# Patient Record
Sex: Female | Born: 1985 | Race: Asian | Hispanic: No | Marital: Married | Smoking: Never smoker
Health system: Southern US, Community
[De-identification: ages and names within clinical notes are randomized; demographics above are authoritative.]

## PROBLEM LIST (undated history)

## (undated) DIAGNOSIS — Z789 Other specified health status: Secondary | ICD-10-CM

## (undated) HISTORY — PX: CYSTECTOMY: SUR359

## (undated) HISTORY — PX: LASIK: SHX215

---

## 2014-03-21 ENCOUNTER — Encounter (HOSPITAL_COMMUNITY): Payer: Self-pay

## 2014-03-21 ENCOUNTER — Inpatient Hospital Stay (HOSPITAL_COMMUNITY)
Admission: AD | Admit: 2014-03-21 | Discharge: 2014-03-21 | Disposition: A | Discharge status: Home / Self Care | Payer: Self-pay | Source: Ambulatory Visit | Attending: Obstetrics & Gynecology | Admitting: Obstetrics & Gynecology

## 2014-03-21 ENCOUNTER — Inpatient Hospital Stay (HOSPITAL_COMMUNITY): Payer: Self-pay

## 2014-03-21 DIAGNOSIS — O200 Threatened abortion: Secondary | ICD-10-CM

## 2014-03-21 DIAGNOSIS — R1032 Left lower quadrant pain: Secondary | ICD-10-CM | POA: Insufficient documentation

## 2014-03-21 DIAGNOSIS — O9989 Other specified diseases and conditions complicating pregnancy, childbirth and the puerperium: Secondary | ICD-10-CM

## 2014-03-21 DIAGNOSIS — R109 Unspecified abdominal pain: Secondary | ICD-10-CM

## 2014-03-21 DIAGNOSIS — O209 Hemorrhage in early pregnancy, unspecified: Secondary | ICD-10-CM

## 2014-03-21 DIAGNOSIS — O26899 Other specified pregnancy related conditions, unspecified trimester: Secondary | ICD-10-CM

## 2014-03-21 DIAGNOSIS — O4691 Antepartum hemorrhage, unspecified, first trimester: Secondary | ICD-10-CM

## 2014-03-21 DIAGNOSIS — Z3A08 8 weeks gestation of pregnancy: Secondary | ICD-10-CM | POA: Insufficient documentation

## 2014-03-21 LAB — URINE MICROSCOPIC-ADD ON

## 2014-03-21 LAB — WET PREP, GENITAL
Clue Cells Wet Prep HPF POC: NONE SEEN
Trich, Wet Prep: NONE SEEN
Yeast Wet Prep HPF POC: NONE SEEN

## 2014-03-21 LAB — CBC
HCT: 36.8 % (ref 36.0–46.0)
Hemoglobin: 12.6 g/dL (ref 12.0–15.0)
MCH: 31.7 pg (ref 26.0–34.0)
MCHC: 34.2 g/dL (ref 30.0–36.0)
MCV: 92.5 fL (ref 78.0–100.0)
Platelets: 280 10*3/uL (ref 150–400)
RBC: 3.98 MIL/uL (ref 3.87–5.11)
RDW: 13.2 % (ref 11.5–15.5)
WBC: 14.4 10*3/uL — ABNORMAL HIGH (ref 4.0–10.5)

## 2014-03-21 LAB — HCG, QUANTITATIVE, PREGNANCY: HCG, BETA CHAIN, QUANT, S: 338 m[IU]/mL — AB (ref <5)

## 2014-03-21 LAB — URINALYSIS, ROUTINE W REFLEX MICROSCOPIC
Bilirubin Urine: NEGATIVE
GLUCOSE, UA: NEGATIVE mg/dL
KETONES UR: 15 mg/dL — AB
LEUKOCYTES UA: NEGATIVE
Nitrite: NEGATIVE
PH: 7 (ref 5.0–8.0)
PROTEIN: 30 mg/dL — AB
Specific Gravity, Urine: 1.025 (ref 1.005–1.030)
Urobilinogen, UA: 0.2 mg/dL (ref 0.0–1.0)

## 2014-03-21 LAB — ABO/RH: ABO/RH(D): B POS

## 2014-03-21 NOTE — MAU Provider Note (Signed)
Chief Complaint: Vaginal Bleeding   First Provider Initiated Contact with Patient 03/21/14 1623     SUBJECTIVE HPI: Stephanie Potter is a 29 y.o. G1P0 at [id number] by LMP who presents to maternity admissions reporting vaginal bleeding that is moderate, like her usual menses, and severe abdominal pain a couple of hours ago, that is now resolved.  She had 2 positive UPTs over the weekend.  She reports onset of spotting 3 days ago that resolved, then LLQ pain yesterday.  This morning, her bleeding started again, and became heavier, requiring a pad. She called EMS and came to MAU related to the severe pain this afternoon.  She denies vaginal itching/burning, urinary symptoms, h/a, dizziness, n/v, or fever/chills.     History reviewed. No pertinent past medical history. History reviewed. No pertinent past surgical history. History   Social History  . Marital Status: Married    Spouse Name: N/A  . Number of Children: N/A  . Years of Education: N/A   Occupational History  . Not on file.   Social History Main Topics  . Smoking status: Never Smoker   . Smokeless tobacco: Not on file  . Alcohol Use: Not on file  . Drug Use: Not on file  . Sexual Activity: Yes    Birth Control/ Protection: None   Other Topics Concern  . Not on file   Social History Narrative  . No narrative on file   No current facility-administered medications on file prior to encounter.   No current outpatient prescriptions on file prior to encounter.   No Known Allergies  ROS: Pertinent items in HPI  OBJECTIVE Blood pressure 110/60, pulse 66, temperature 98 F (36.7 C), temperature source Oral, resp. rate 18, last menstrual period 01/19/2014. GENERAL: Well-developed, well-nourished female in no acute distress.  HEENT: Normocephalic HEART: normal rate RESP: normal effort ABDOMEN: Soft, non-tender EXTREMITIES: Nontender, no edema NEURO: Alert and oriented Pelvic exam: Cervix pink, visually closed, without lesion,  moderate amount dark red bleeding with some gray tissue in vaginal vault removed with ring forceps, vaginal walls and external genitalia normal Bimanual exam: Cervix 0/long/high, firm, anterior, neg CMT, uterus nontender, nonenlarged, adnexa without tenderness, enlargement, or mass  LAB RESULTS Results for orders placed or performed during the hospital encounter of 03/21/14 (from the past 24 hour(s))  Urinalysis, Routine w reflex microscopic     Status: Abnormal   Collection Time: 03/21/14  4:00 PM  Result Value Ref Range   Color, Urine AMBER (A) YELLOW   APPearance HAZY (A) CLEAR   Specific Gravity, Urine 1.025 1.005 - 1.030   pH 7.0 5.0 - 8.0   Glucose, UA NEGATIVE NEGATIVE mg/dL   Hgb urine dipstick LARGE (A) NEGATIVE   Bilirubin Urine NEGATIVE NEGATIVE   Ketones, ur 15 (A) NEGATIVE mg/dL   Protein, ur 30 (A) NEGATIVE mg/dL   Urobilinogen, UA 0.2 0.0 - 1.0 mg/dL   Nitrite NEGATIVE NEGATIVE   Leukocytes, UA NEGATIVE NEGATIVE  Urine microscopic-add on     Status: Abnormal   Collection Time: 03/21/14  4:00 PM  Result Value Ref Range   Squamous Epithelial / LPF RARE RARE   WBC, UA 0-2 <3 WBC/hpf   RBC / HPF TOO NUMEROUS TO COUNT <3 RBC/hpf   Bacteria, UA FEW (A) RARE   Urine-Other MUCOUS PRESENT   Wet prep, genital     Status: Abnormal   Collection Time: 03/21/14  4:29 PM  Result Value Ref Range   Yeast Wet Prep HPF POC NONE SEEN  NONE SEEN   Trich, Wet Prep NONE SEEN NONE SEEN   Clue Cells Wet Prep HPF POC NONE SEEN NONE SEEN   WBC, Wet Prep HPF POC FEW (A) NONE SEEN  CBC     Status: Abnormal   Collection Time: 03/21/14  4:50 PM  Result Value Ref Range   WBC 14.4 (H) 4.0 - 10.5 K/uL   RBC 3.98 3.87 - 5.11 MIL/uL   Hemoglobin 12.6 12.0 - 15.0 g/dL   HCT 16.1 09.6 - 04.5 %   MCV 92.5 78.0 - 100.0 fL   MCH 31.7 26.0 - 34.0 pg   MCHC 34.2 30.0 - 36.0 g/dL   RDW 40.9 81.1 - 91.4 %   Platelets 280 150 - 400 K/uL  hCG, quantitative, pregnancy     Status: Abnormal   Collection  Time: 03/21/14  4:50 PM  Result Value Ref Range   hCG, Beta Chain, Quant, S 338 (H) <5 mIU/mL  ABO/Rh     Status: None (Preliminary result)   Collection Time: 03/21/14  4:50 PM  Result Value Ref Range   ABO/RH(D) B POS     IMAGING US Ob Comp Less 14 Wks  03/21/2014   CLINICAL DATA:  Heavy vaginal bleeding starting at 10 a.m. today. Early pregnancy.  EXAM: OBSTETRIC <14 WK [place] AND TRANSVAGINAL OB US  TECHNIQUE: Both transabdominal and transvaginal ultrasound examinations were performed for complete evaluation of the gestation as well as the maternal uterus, adnexal regions, and pelvic cul-de-sac. Transvaginal technique was performed to assess early pregnancy.  COMPARISON:  None.  FINDINGS: Intrauterine gestational sac: Visualized, but abnormal, with heterogeneous echogenicity in a 1.4 by 1.1 by 1.2 cm sac.  Yolk sac:  Not definitively seen.  Embryo:  Not definitively seen  Cardiac Activity: Absent  MSD: 12  mm   6 w   0  d  Maternal uterus/adnexae: Small amount of free pelvic fluid. The ovaries appear unremarkable.  IMPRESSION: 1. Abnormal gestational sac versus blood products in the endometrial canal, with mixed echogenicity, high echogenicity rim, and irregular internal high echogenicity within the apparent sac. This appearance is of very low likelihood to be compatible with a successful pregnancy. Currently no discernible yolk sac or embryo is specifically identifiable.   Electronically Signed   By: Gaylyn Rong M.D.   On: 03/21/2014 18:18   US Ob Transvaginal  03/21/2014   CLINICAL DATA:  Heavy vaginal bleeding starting at 10 a.m. today. Early pregnancy.  EXAM: OBSTETRIC <14 WK [place] AND TRANSVAGINAL OB US  TECHNIQUE: Both transabdominal and transvaginal ultrasound examinations were performed for complete evaluation of the gestation as well as the maternal uterus, adnexal regions, and pelvic cul-de-sac. Transvaginal technique was performed to assess early pregnancy.  COMPARISON:  None.  FINDINGS:  Intrauterine gestational sac: Visualized, but abnormal, with heterogeneous echogenicity in a 1.4 by 1.1 by 1.2 cm sac.  Yolk sac:  Not definitively seen.  Embryo:  Not definitively seen  Cardiac Activity: Absent  MSD: 12  mm   6 w   0  d  Maternal uterus/adnexae: Small amount of free pelvic fluid. The ovaries appear unremarkable.  IMPRESSION: 1. Abnormal gestational sac versus blood products in the endometrial canal, with mixed echogenicity, high echogenicity rim, and irregular internal high echogenicity within the apparent sac. This appearance is of very low likelihood to be compatible with a successful pregnancy. Currently no discernible yolk sac or embryo is specifically identifiable.   Electronically Signed   By: Annitta Needs.D.  On: 03/21/2014 18:18    ASSESSMENT 1. Abdominal pain affecting pregnancy   2. Vaginal bleeding in pregnancy, first trimester   3. Threatened miscarriage in early pregnancy     PLAN Discharge home with bleeding precautions Discussed likely miscarriage with pt and s/o.  Questions answered. Pt to return to MAU in 48 hours  Follow-up Information    Follow up with [facility] [place] [facility].   Why:  On Wednesday after 5 pm for repeat labs.  Return sooner as needed for emergencies.   Contact information:   [address] [id number] mc [place] [address] [phone]      Sharen Counter Certified Nurse-Midwife 03/21/2014  7:16 PM

## 2014-03-21 NOTE — MAU Note (Signed)
UPT positive

## 2014-03-21 NOTE — Discharge Instructions (Signed)

## 2014-03-21 NOTE — MAU Note (Signed)
Pt presents complaining of heavy vaginal bleeding and lower abdominal pain that is so bad it makes her sweat. States she had two +HPTs this weekend.

## 2014-03-22 LAB — GC/CHLAMYDIA PROBE AMP (~~LOC~~) NOT AT ARMC
Chlamydia: NEGATIVE
Neisseria Gonorrhea: NEGATIVE

## 2014-03-22 LAB — HIV ANTIBODY (ROUTINE TESTING W REFLEX): HIV Screen 4th Generation wRfx: NONREACTIVE

## 2014-03-23 ENCOUNTER — Inpatient Hospital Stay (HOSPITAL_COMMUNITY)
Admission: AD | Admit: 2014-03-23 | Discharge: 2014-03-23 | Discharge status: Against Medical Advice | Payer: Self-pay | Source: Ambulatory Visit | Attending: Obstetrics and Gynecology | Admitting: Obstetrics and Gynecology

## 2014-03-23 DIAGNOSIS — Z5321 Procedure and treatment not carried out due to patient leaving prior to being seen by health care provider: Secondary | ICD-10-CM | POA: Insufficient documentation

## 2014-03-23 LAB — HCG, QUANTITATIVE, PREGNANCY: hCG, Beta Chain, Quant, S: 72 m[IU]/mL — ABNORMAL HIGH (ref <5)

## 2014-03-23 NOTE — MAU Note (Signed)
Not in lobby

## 2014-03-23 NOTE — MAU Note (Signed)
Per registration pt reported she was leaving and would call back for results. Pt not in lobby.

## 2015-01-24 ENCOUNTER — Encounter (HOSPITAL_COMMUNITY): Payer: Self-pay | Admitting: *Deleted

## 2015-10-09 IMAGING — US US OB COMP LESS 14 WK
1 series · 14 of 28 positions shown · non-contrast
Comparison: None.

CLINICAL DATA: Heavy vaginal bleeding starting at 10 a.m. today.
Early pregnancy.

EXAM:
OBSTETRIC <14 WK US AND TRANSVAGINAL OB US
TECHNIQUE: Both transabdominal and transvaginal ultrasound examinations were
performed for complete evaluation of the gestation as well as the
maternal uterus, adnexal regions, and pelvic cul-de-sac.
Transvaginal technique was performed to assess early pregnancy.

[Series 1: us ob comp less 14 wk · 32 acquisitions, 14 frames shown]
[im 2/32]
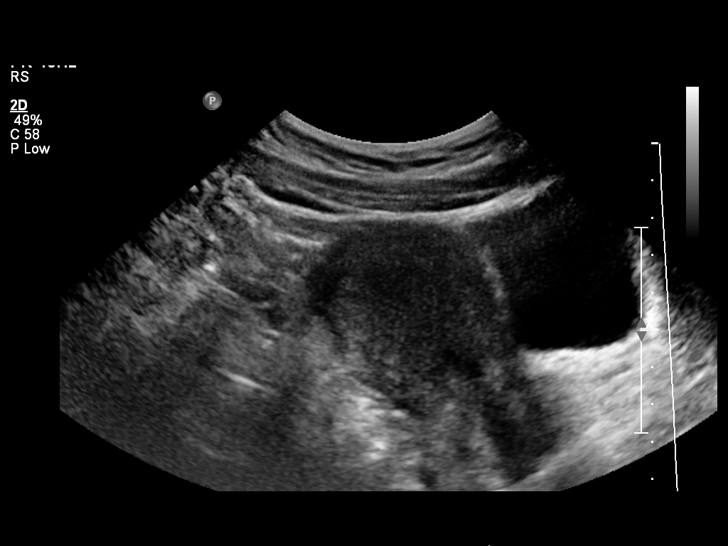
[im 4/32]
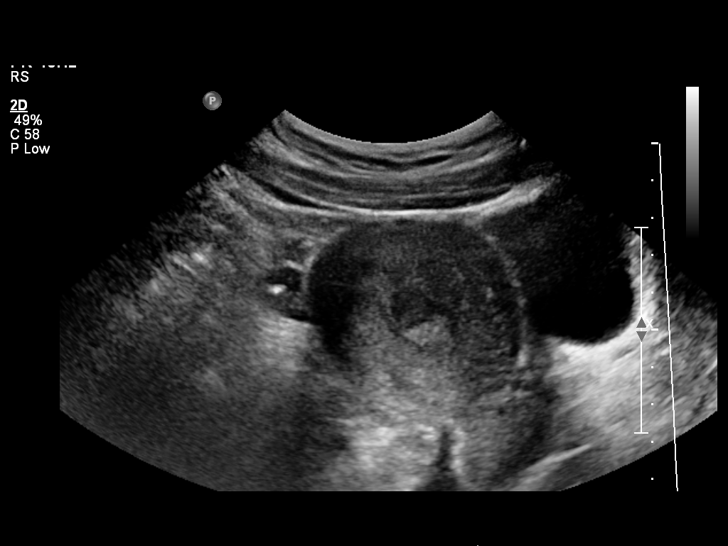
[im 6/32]
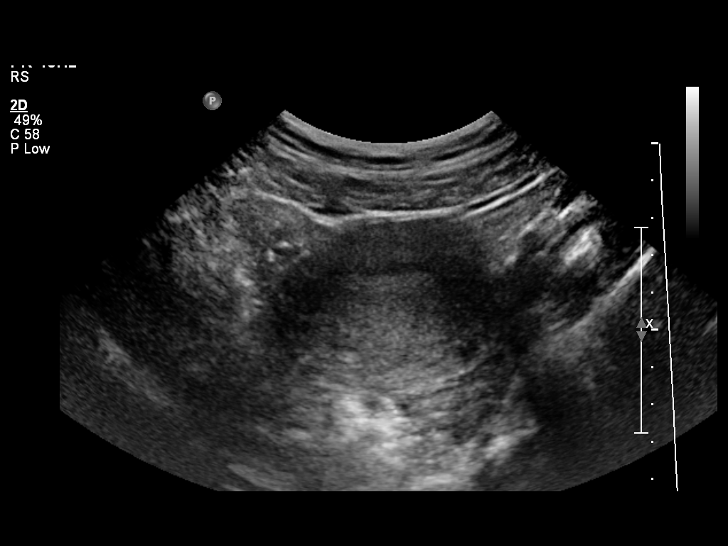
[im 9/32]
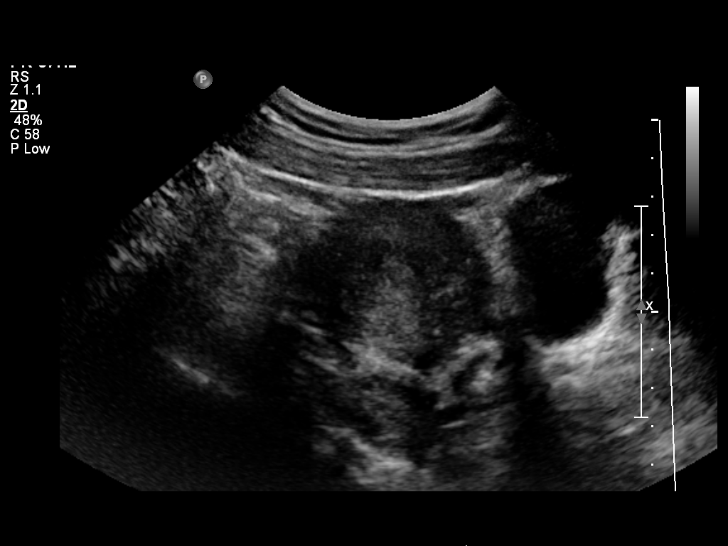
[im 11/32]
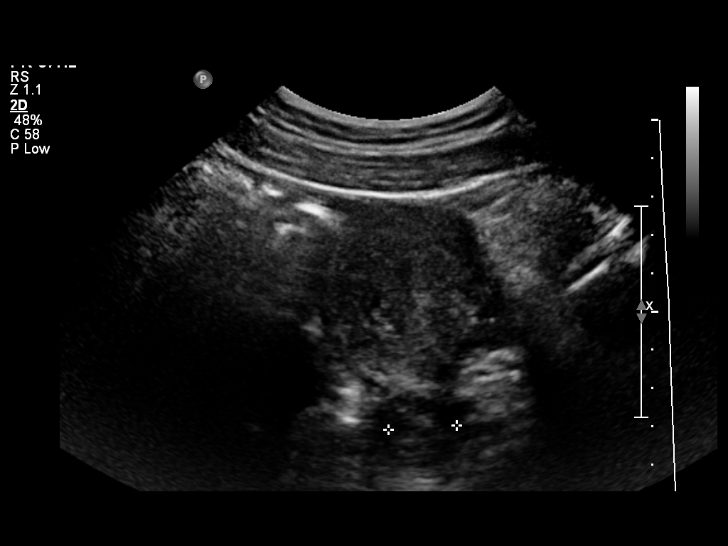
[im 13/32]
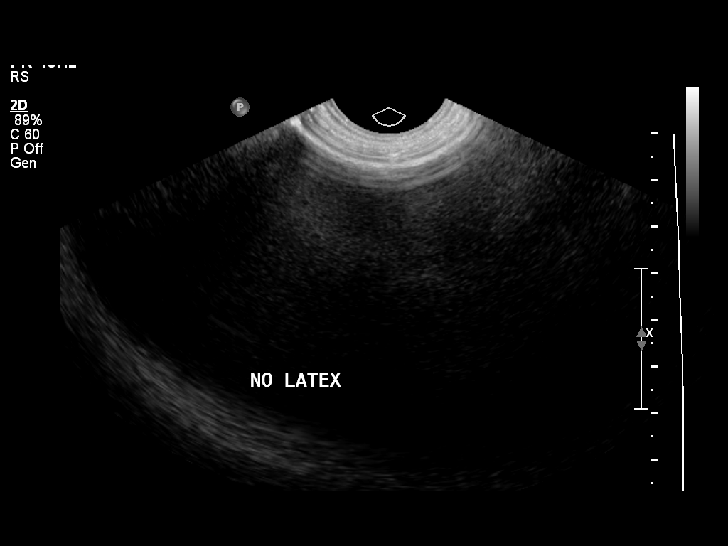
[im 15/32]
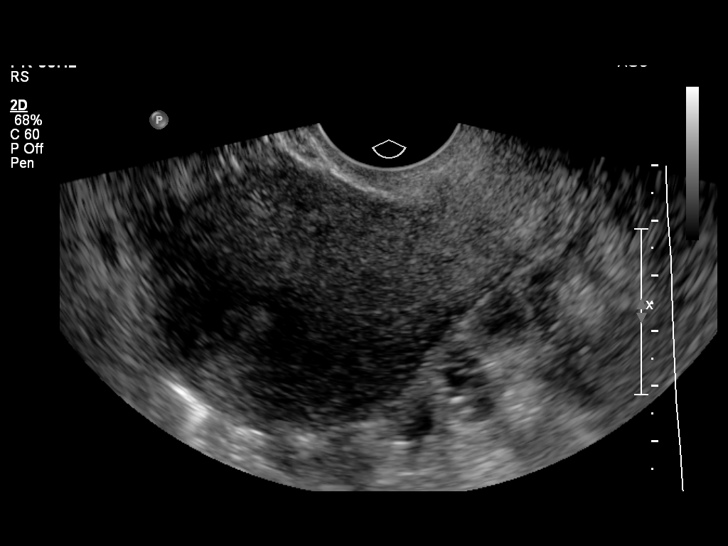
[im 18/32]
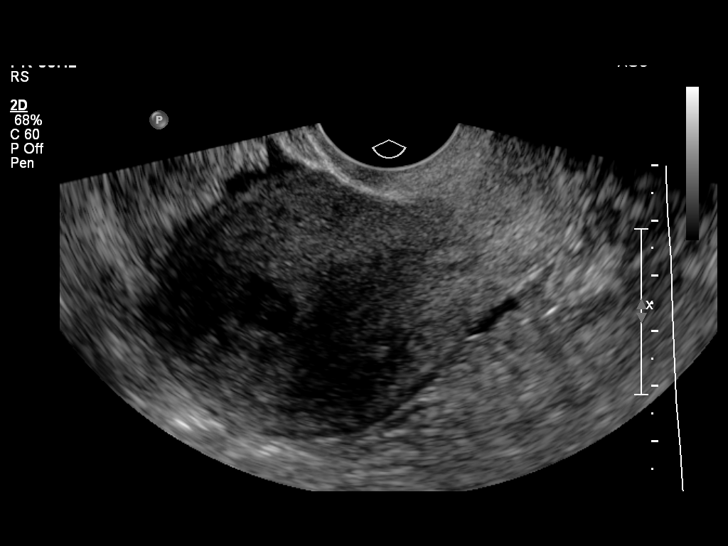
[im 20/32]
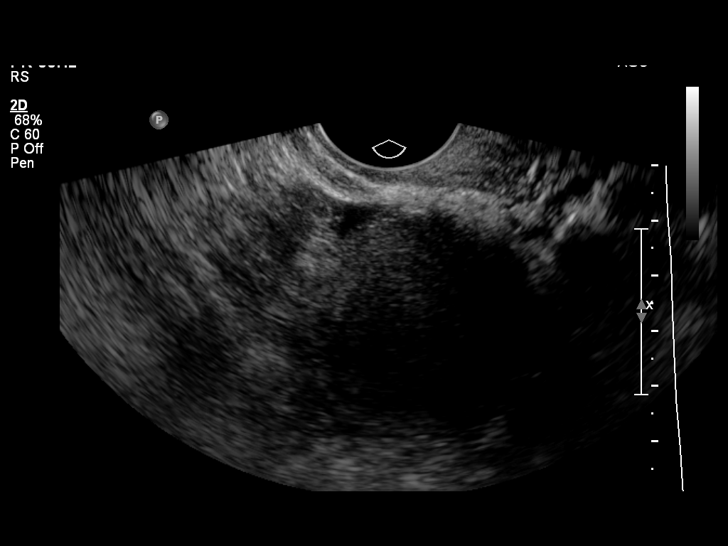
[im 22/32]
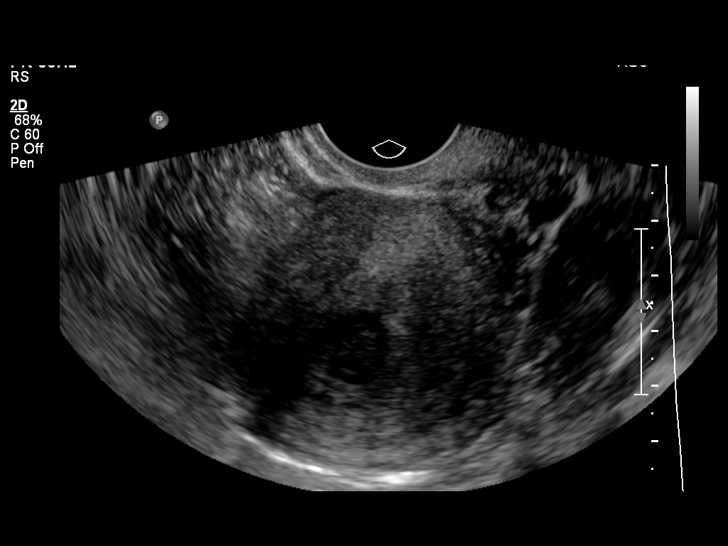
[im 25/32]
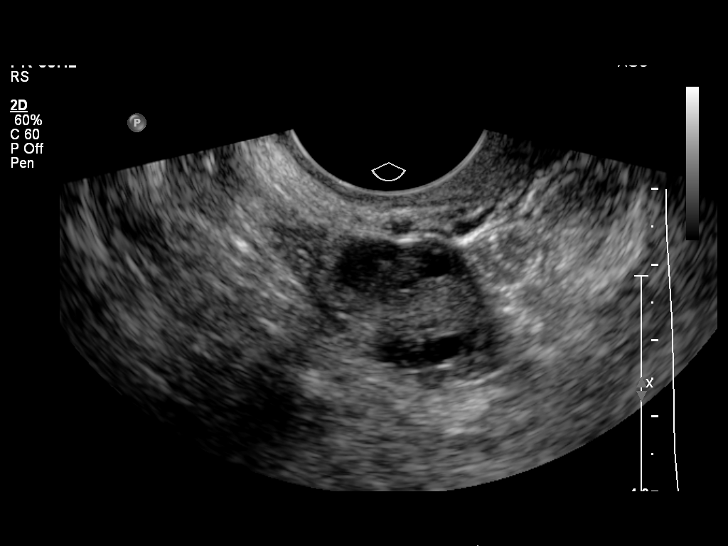
[im 27/32]
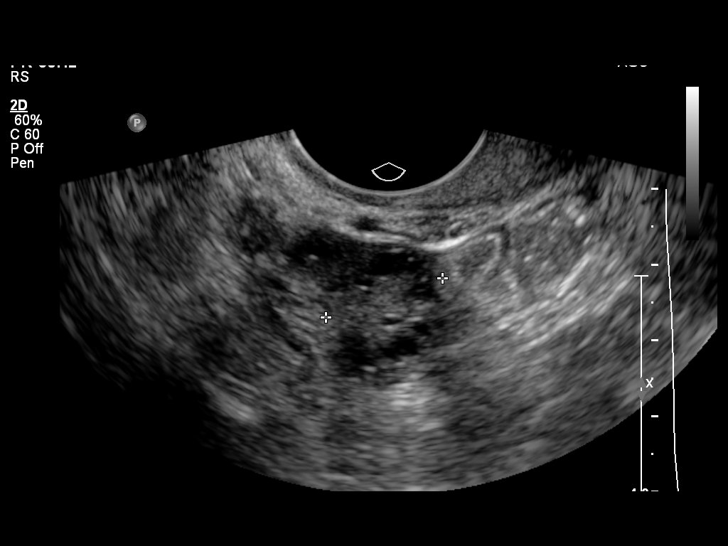
[im 29/32]
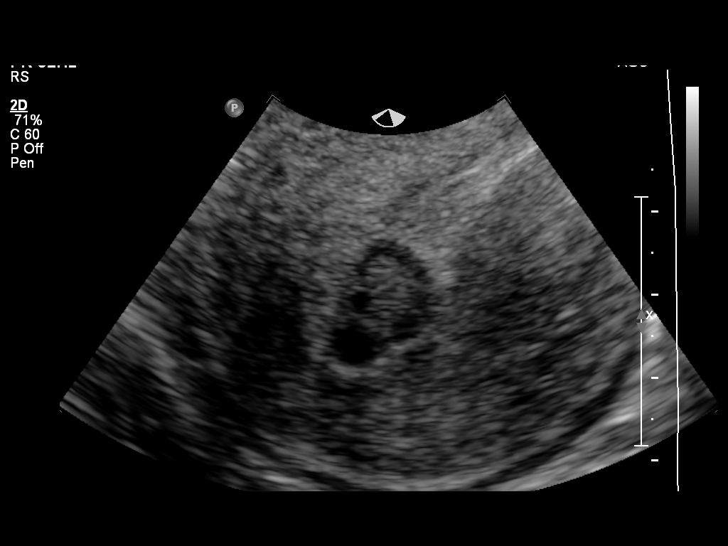
[im 32/32]
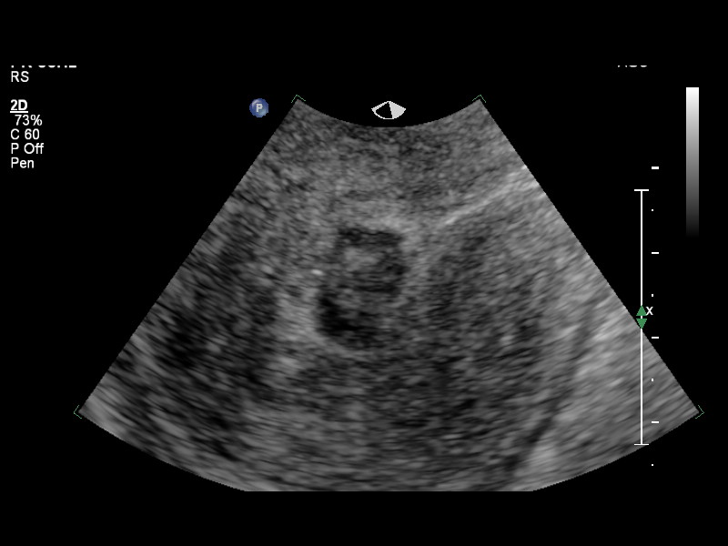

[14 of 28 positions shown; findings below may reference images not displayed]

FINDINGS: Intrauterine gestational sac: Visualized, but abnormal, with
heterogeneous echogenicity in a 1.4 by 1.1 by 1.2 cm sac.

Yolk sac:  Not definitively seen.

Embryo:  Not definitively seen

Cardiac Activity: Absent

MSD: 12  mm   6 w   0  d

Maternal uterus/adnexae: Small amount of free pelvic fluid. The
ovaries appear unremarkable.
IMPRESSION: 1. Abnormal gestational sac versus blood products in the endometrial
canal, with mixed echogenicity, high echogenicity rim, and irregular
internal high echogenicity within the apparent sac. This appearance
is of very low likelihood to be compatible with a successful
pregnancy. Currently no discernible yolk sac or embryo is
specifically identifiable.

## 2015-11-01 ENCOUNTER — Other Ambulatory Visit: Payer: Self-pay | Admitting: Obstetrics and Gynecology

## 2015-11-01 ENCOUNTER — Encounter: Payer: Self-pay | Admitting: Obstetrics and Gynecology

## 2015-11-01 ENCOUNTER — Ambulatory Visit (INDEPENDENT_AMBULATORY_CARE_PROVIDER_SITE_OTHER): Payer: BLUE CROSS/BLUE SHIELD | Admitting: Obstetrics and Gynecology

## 2015-11-01 VITALS — BP 111/70 | HR 68 | Ht 61.0 in | Wt 115.9 lb

## 2015-11-01 DIAGNOSIS — Z3201 Encounter for pregnancy test, result positive: Secondary | ICD-10-CM

## 2015-11-01 DIAGNOSIS — O3680X0 Pregnancy with inconclusive fetal viability, not applicable or unspecified: Secondary | ICD-10-CM

## 2015-11-01 DIAGNOSIS — N926 Irregular menstruation, unspecified: Secondary | ICD-10-CM

## 2015-11-01 DIAGNOSIS — Z3687 Encounter for antenatal screening for uncertain dates: Secondary | ICD-10-CM

## 2015-11-01 LAB — POCT URINE PREGNANCY: Preg Test, Ur: POSITIVE — AB

## 2015-11-01 NOTE — Progress Notes (Signed)
    GYNECOLOGY CLINIC PROGRESS NOTE  Subjective:    Stephanie Potter is a 30 y.o. [id number] female who presents for evaluation of menstrual irregularity. She believes she could be pregnant. Pregnancy is desired. Sexual Activity: single partner, contraception: none. Current symptoms also include: nausea and positive home pregnancy test. Last period was abnormal.  Patient's last menstrual period was 05/23/2015 (exact date). Reports a negative urine pregnancy test August 9th, Sept 4 pregnancy test was positive.    The following portions of the patient's history were reviewed and updated as appropriate:   She  has no past medical history on file.   She  has a past surgical history that includes LASIK.   Her family history is not on file.   She  reports that she has never smoked. She has never used smokeless tobacco. She reports that she does not drink alcohol or use drugs.   She has a current medication list which includes the following prescription(s): prenatal vit-fe fumarate-fa.   She has No Known Allergies..  Review of Systems Pertinent items noted in HPI and remainder of comprehensive ROS otherwise negative.     Objective:    BP 111/70   Pulse 68   Ht 5\' 1"  (1.549 m)   Wt 115 lb 14.4 oz (52.6 kg)   LMP 05/23/2015 (Exact Date) Comment: Irregular menses  BMI 21.90 kg/m  General: alert, no distress and no acute distress   Abdomen:  Soft, non-tender.  No masses palpable.  No fetal heart tones appreciated     Lab Review Urine HCG: positive    Assessment:    Absence of menstruation.    Irregular menses, with unsure LMP   Plan:    Pregnancy Test: Positive: EDC: unknown due to irregular menses and unsure LMP. Briefly discussed pre-natal care options. Pregnancy, Childbirth and the Newborn book given. Encouraged well-balanced diet, plenty of rest when needed, pre-natal vitamins daily and walking for exercise. Discussed self-help for nausea, avoiding OTC medications until consulting  provider or pharmacist, other than Tylenol as needed, minimal caffeine (1-2 cups daily) and avoiding alcohol. She will schedule her initial OB visit in the next month with her PCP or OB provider. Feel free to call with any questions. Will schedule patient for dating scan in next several days.      Hildred LaserAnika Tala Eber, MD [facility]

## 2015-11-01 NOTE — Patient Instructions (Signed)
Prenatal Care °WHAT IS PRENATAL CARE?  °Prenatal care is the process of caring for a pregnant woman before she gives birth. Prenatal care makes sure that she and her baby remain as healthy as possible throughout pregnancy. Prenatal care may be provided by a midwife, family practice health care provider, or a childbirth and pregnancy specialist (obstetrician). Prenatal care may include physical examinations, testing, treatments, and education on nutrition, lifestyle, and social support services. °WHY IS PRENATAL CARE SO IMPORTANT?  °Early and consistent prenatal care increases the chance that you and your baby will remain healthy throughout your pregnancy. This type of care also decreases a baby's risk of being born too early (prematurely), or being born smaller than expected (small for gestational age). Any underlying medical conditions you may have that could pose a risk during your pregnancy are discussed during prenatal care visits. You will also be monitored regularly for any new conditions that may arise during your pregnancy so they can be treated quickly and effectively. °WHAT HAPPENS DURING PRENATAL CARE VISITS? °Prenatal care visits may include the following: °Discussion °Tell your health care provider about any new signs or symptoms you have experienced since your last visit. These might include: °· Nausea or vomiting. °· Increased or decreased level of energy. °· Difficulty sleeping. °· Back or leg pain. °· Weight changes. °· Frequent urination. °· Shortness of breath with physical activity. °· Changes in your skin, such as the development of a rash or itchiness. °· Vaginal discharge or bleeding. °· Feelings of excitement or nervousness. °· Changes in your baby's movements. °You may want to write down any questions or topics you want to discuss with your health care provider and bring them with you to your appointment. °Examination °During your first prenatal care visit, you will likely have a complete  physical exam. Your health care provider will often examine your vagina, cervix, and the position of your uterus, as well as check your heart, lungs, and other body systems. As your pregnancy progresses, your health care provider will measure the size of your uterus and your baby's position inside your uterus. He or she may also examine you for early signs of labor. Your prenatal visits may also include checking your blood pressure and, after about 10-12 weeks of pregnancy, listening to your baby's heartbeat. °Testing °Regular testing often includes: °· Urinalysis. This checks your urine for glucose, protein, or signs of infection. °· Blood count. This checks the levels of white and red blood cells in your body. °· Tests for sexually transmitted infections (STIs). Testing for STIs at the beginning of pregnancy is routinely done and is required in many states. °· Antibody testing. You will be checked to see if you are immune to certain illnesses, such as rubella, that can affect a developing fetus. °· Glucose screen. Around 24-28 weeks of pregnancy, your blood glucose level will be checked for signs of gestational diabetes. Follow-up tests may be recommended. °· Group B strep. This is a bacteria that is commonly found inside a woman's vagina. This test will inform your health care provider if you need an antibiotic to reduce the amount of this bacteria in your body prior to labor and childbirth. °· Ultrasound. Many pregnant women undergo an ultrasound screening around 18-20 weeks of pregnancy to evaluate the health of the fetus and check for any developmental abnormalities. °· HIV (human immunodeficiency virus) testing. Early in your pregnancy, you will be screened for HIV. If you are at high risk for HIV, this test   may be repeated during your third trimester of pregnancy. °You may be offered other testing based on your age, personal or family medical history, or other factors.  °HOW OFTEN SHOULD I PLAN TO SEE MY  HEALTH CARE PROVIDER FOR PRENATAL CARE? °Your prenatal care check-up schedule depends on any medical conditions you have before, or develop during, your pregnancy. If you do not have any underlying medical conditions, you will likely be seen for checkups: °· Monthly, during the first 6 months of pregnancy. °· Twice a month during months 7 and 8 of pregnancy. °· Weekly starting in the 9th month of pregnancy and until delivery. °If you develop signs of early labor or other concerning signs or symptoms, you may need to see your health care provider more often. Ask your health care provider what prenatal care schedule is best for you. °WHAT CAN I DO TO KEEP MYSELF AND MY BABY AS HEALTHY AS POSSIBLE DURING MY PREGNANCY? °· Take a prenatal vitamin containing 400 micrograms (0.4 mg) of folic acid every day. Your health care provider may also ask you to take additional vitamins such as iodine, vitamin D, iron, copper, and zinc. °· Take 1500-2000 mg of calcium daily starting at your 20th week of pregnancy until you deliver your baby. °· Make sure you are up to date on your vaccinations. Unless directed otherwise by your health care provider: °¨ You should receive a tetanus, diphtheria, and pertussis (Tdap) vaccination between the 27th and 36th week of your pregnancy, regardless of when your last Tdap immunization occurred. This helps protect your baby from whooping cough (pertussis) after he or she is born. °¨ You should receive an annual inactivated influenza vaccine (IIV) to help protect you and your baby from influenza. This can be done at any point during your pregnancy. °· Eat a well-rounded diet that includes: °¨ Fresh fruits and vegetables. °¨ Lean proteins. °¨ Calcium-rich foods such as milk, yogurt, hard cheeses, and dark, leafy greens. °¨ Whole grain breads. °· Do not eat seafood high in mercury, including: °¨ Swordfish. °¨ Tilefish. °¨ Shark. °¨ King mackerel. °¨ More than 6 oz tuna per week. °· Do not eat: °¨ Raw  or undercooked meats or eggs. °¨ Unpasteurized foods, such as soft cheeses (brie, blue, or feta), juices, and milks. °¨ Lunch meats. °¨ Hot dogs that have not been heated until they are steaming. °· Drink enough water to keep your urine clear or pale yellow. For many women, this may be 10 or more 8 oz glasses of water each day. Keeping yourself hydrated helps deliver nutrients to your baby and may prevent the start of pre-term uterine contractions. °· Do not use any tobacco products including cigarettes, chewing tobacco, or electronic cigarettes. If you need help quitting, ask your health care provider. °· Do not drink beverages containing alcohol. No safe level of alcohol consumption during pregnancy has been determined. °· Do not use any illegal drugs. These can harm your developing baby or cause a miscarriage. °· Ask your health care provider or pharmacist before taking any prescription or over-the-counter medicines, herbs, or supplements. °· Limit your caffeine intake to no more than 200 mg per day. °· Exercise. Unless told otherwise by your health care provider, try to get 30 minutes of moderate exercise most days of the week. Do not  do high-impact activities, contact sports, or activities with a high risk of falling, such as horseback riding or downhill skiing. °· Get plenty of rest. °· Avoid anything that raises your   body temperature, such as hot tubs and saunas. °· If you own a cat, do not empty its litter box. Bacteria contained in cat feces can cause an infection called toxoplasmosis. This can result in serious harm to the fetus. °· Stay away from chemicals such as insecticides, lead, mercury, and cleaning or paint products that contain solvents. °· Do not have any X-rays taken unless medically necessary. °· Take a childbirth and breastfeeding preparation class. Ask your health care provider if you need a referral or recommendation. °  °This information is not intended to replace advice given to you by  your health care provider. Make sure you discuss any questions you have with your health care provider. °  °Document Released: 01/17/2003 Document Revised: 02/04/2014 Document Reviewed: 03/31/2013 °Elsevier Interactive Patient Education ©2016 [employer] ° °

## 2015-11-01 NOTE — Progress Notes (Signed)
Patient ID: Stephanie Potter, female   DOB: 06/15/1985, 30 y.o.   MRN: [id number] UPT POSITIVE. LMP: 05/23/2015 (IRREGULAR MENSES). EDD: 02/27/2016. EGA: 23.1 wks Pt informed she will need ultrasound for dating and viability and would like to wait until husband can be here.

## 2015-11-02 ENCOUNTER — Other Ambulatory Visit: Payer: Self-pay | Admitting: Obstetrics and Gynecology

## 2015-11-02 ENCOUNTER — Ambulatory Visit (INDEPENDENT_AMBULATORY_CARE_PROVIDER_SITE_OTHER): Payer: BLUE CROSS/BLUE SHIELD

## 2015-11-02 DIAGNOSIS — O3680X0 Pregnancy with inconclusive fetal viability, not applicable or unspecified: Secondary | ICD-10-CM | POA: Diagnosis not present

## 2015-11-10 ENCOUNTER — Encounter: Payer: Self-pay | Admitting: Obstetrics and Gynecology

## 2015-11-21 ENCOUNTER — Encounter: Payer: BLUE CROSS/BLUE SHIELD | Admitting: Obstetrics and Gynecology

## 2015-11-22 ENCOUNTER — Encounter: Payer: Self-pay | Admitting: Obstetrics and Gynecology

## 2015-11-22 ENCOUNTER — Ambulatory Visit (INDEPENDENT_AMBULATORY_CARE_PROVIDER_SITE_OTHER): Payer: BLUE CROSS/BLUE SHIELD | Admitting: Obstetrics and Gynecology

## 2015-11-22 VITALS — BP 102/61 | HR 62 | Wt 116.8 lb

## 2015-11-22 DIAGNOSIS — Z124 Encounter for screening for malignant neoplasm of cervix: Secondary | ICD-10-CM

## 2015-11-22 DIAGNOSIS — O219 Vomiting of pregnancy, unspecified: Secondary | ICD-10-CM

## 2015-11-22 DIAGNOSIS — Z3482 Encounter for supervision of other normal pregnancy, second trimester: Secondary | ICD-10-CM

## 2015-11-22 DIAGNOSIS — O09292 Supervision of pregnancy with other poor reproductive or obstetric history, second trimester: Secondary | ICD-10-CM

## 2015-11-22 LAB — POCT URINALYSIS DIPSTICK
Bilirubin, UA: NEGATIVE
GLUCOSE UA: NEGATIVE
Ketones, UA: NEGATIVE
Nitrite, UA: NEGATIVE
Protein, UA: NEGATIVE
SPEC GRAV UA: 1.01
UROBILINOGEN UA: NEGATIVE
pH, UA: 6

## 2015-11-22 NOTE — Progress Notes (Signed)
OBSTETRIC INITIAL PRENATAL VISIT  Subjective:    Stephanie Potter is being seen today for her first obstetrical visit.  This is a planned pregnancy. She is a G2P0010 female at [redacted]w[redacted]d gestation, Estimated Date of Delivery: 06/05/16 by 9 week sono, inconsistent with patient's LMP of 05/23/2015 (exact date).  Her obstetrical history is significant for none. Relationship with FOB: spouse, living together. Patient does intend to breast feed. Pregnancy history fully reviewed.    Obstetric History   G2   P0   T0   P0   A1   L0    SAB0   TAB0   Ectopic0   Multiple0   Live Births0     # Outcome Date GA Lbr Len/2nd Weight Sex Delivery Anes PTL Lv  2 Current           1 SAB 2016        ND    Obstetric Comments  early    Gynecologic History:  Last pap smear was unknown. Patient unsure if she has ever had a pap smear (but may be due to language barrier) Denies  history of STIs.  Contraception: None   History reviewed. No pertinent past medical history.   No family history on file.   Past Surgical History:  Procedure Laterality Date  . CYSTECTOMY    . LASIK       Social History   Social History  . Marital status: Married    Spouse name: N/A  . Number of children: N/A  . Years of education: N/A   Occupational History  . Not on file.   Social History Main Topics  . Smoking status: Never Smoker  . Smokeless tobacco: Never Used  . Alcohol use No  . Drug use: No  . Sexual activity: Yes    Partners: Male    Birth control/ protection: None   Other Topics Concern  . Not on file   Social History Narrative  . No narrative on file    Current Outpatient Prescriptions on File Prior to Visit  Medication Sig Dispense Refill  . Prenatal Vit-Fe Fumarate-FA (PRENATAL 1 PLUS 1 PO) Take by mouth.     No current facility-administered medications on file prior to visit.     No Known Allergies   Review of Systems General:Not Present- Fever, Weight Loss and Weight Gain. Skin:Not  Present- Rash. HEENT:Not Present- Blurred Vision, Headache and Bleeding Gums. Respiratory:Not Present- Difficulty Breathing. Breast:Not Present- Breast Mass. Cardiovascular:Not Present- Chest Pain, Elevated Blood Pressure, Fainting / Blacking Out and Shortness of Breath. Gastrointestinal:Present - Nausea and Vomiting (but is improving some with diet). Not Present- Abdominal Pain, Constipation.  Female Genitourinary:Not Present- Frequency, Painful Urination, Pelvic Pain, Vaginal Bleeding, Vaginal Discharge, Contractions, regular, Fetal Movements Decreased, Urinary Complaints and Vaginal Fluid. Musculoskeletal:Not Present- Back Pain and Leg Cramps. Neurological:Not Present- Dizziness. Psychiatric:Not Present- Depression.     Objective:   Blood pressure 102/61, pulse 62, weight 116 lb 12.8 oz (53 kg), last menstrual period 05/23/2015, unknown if currently breastfeeding.  Body mass index is 22.07 kg/m.   General Appearance:    Alert, cooperative, no distress, appears stated age  Head:    Normocephalic, without obvious abnormality, atraumatic  Eyes:    PERRL, conjunctiva/corneas clear, EOM's intact, both eyes  Ears:    Normal external ear canals, both ears  Nose:   Nares normal, septum midline, mucosa normal, no drainage or sinus tenderness  Throat:   Lips, mucosa, and tongue normal; teeth and gums normal  Neck:   Supple, symmetrical, trachea midline, no adenopathy; thyroid: no enlargement/tenderness/nodules; no carotid bruit or JVD  Back:     Symmetric, no curvature, ROM normal, no CVA tenderness  Lungs:     Clear to auscultation bilaterally, respirations unlabored  Chest Wall:    No tenderness or deformity   Heart:    Regular rate and rhythm, S1 and S2 normal, no murmur, rub or gallop  Breast Exam:    No tenderness, masses, or nipple abnormality  Abdomen:     Soft, non-tender, bowel sounds active all four quadrants, no masses, no organomegaly.  FH 12.  FHT 157  bpm.  Genitalia:     Pelvic:external genitalia normal, vagina without lesions, discharge, or tenderness, rectovaginal septum  normal. Cervix normal in appearance, no cervical motion tenderness, no adnexal masses or tenderness.  Pregnancy positive findings: uterine enlargement: 12 wk size, nontender.   Rectal:    Normal external sphincter.  No hemorrhoids appreciated. Internal exam not done.   Extremities:   Extremities normal, atraumatic, no cyanosis or edema  Pulses:   2+ and symmetric all extremities  Skin:   Skin color, texture, turgor normal, no rashes or lesions  Lymph nodes:   Cervical, supraclavicular, and axillary nodes normal  Neurologic:   CNII-XII intact, normal strength, sensation and reflexes throughout       Assessment:   Pregnancy at 12 and 0/7 weeks  Nausea/vomiting of pregnancy Cervical cancer screening  H/o miscarriage  Plan:   Initial labs reviewed. Pap smear performed today.  Prenatal vitamins encouraged.  Problem list reviewed and updated. Diclegis samples given for nausea/vomiting, can prescribe if desired. New OB counseling:  The patient has been given an overview regarding routine prenatal care.  Recommendations regarding diet, weight gain, and exercise in pregnancy were given. Prenatal testing, optional genetic testing, and ultrasound use in pregnancy were reviewed.  Desires Panorama, performed today.  Benefits of Breast Feeding were discussed. The patient is encouraged to consider nursing her baby post partum. Follow up in 4 weeks.  50% of 30 min visit spent on counseling and coordination of care.     Hildred LaserAnika Chanti Golubski, MD [facility]

## 2015-11-23 LAB — VARICELLA ZOSTER ANTIBODY, IGG: Varicella zoster IgG: 3102 index (ref >165)

## 2015-11-23 LAB — CBC WITH DIFFERENTIAL/PLATELET
BASOS: 0 %
Basophils Absolute: 0 10*3/uL (ref 0.0–0.2)
EOS (ABSOLUTE): 0.1 10*3/uL (ref 0.0–0.4)
EOS: 1 %
Hematocrit: 38.5 % (ref 34.0–46.6)
Hemoglobin: 12.8 g/dL (ref 11.1–15.9)
IMMATURE GRANS (ABS): 0 10*3/uL (ref 0.0–0.1)
Immature Granulocytes: 0 %
Lymphocytes Absolute: 2 10*3/uL (ref 0.7–3.1)
Lymphs: 21 %
MCH: 30.3 pg (ref 26.6–33.0)
MCHC: 33.2 g/dL (ref 31.5–35.7)
MCV: 91 fL (ref 79–97)
MONOS ABS: 0.7 10*3/uL (ref 0.1–0.9)
Monocytes: 7 %
NEUTROS PCT: 71 %
Neutrophils Absolute: 6.8 10*3/uL (ref 1.4–7.0)
Platelets: 326 10*3/uL (ref 150–379)
RBC: 4.23 x10E6/uL (ref 3.77–5.28)
RDW: 13.8 % (ref 12.3–15.4)
WBC: 9.6 10*3/uL (ref 3.4–10.8)

## 2015-11-23 LAB — RUBELLA SCREEN: Rubella Antibodies, IGG: 6.65 index (ref >0.99)

## 2015-11-23 LAB — ABO AND RH: Rh Factor: POSITIVE

## 2015-11-23 LAB — HIV ANTIBODY (ROUTINE TESTING W REFLEX): HIV SCREEN 4TH GENERATION: NONREACTIVE

## 2015-11-23 LAB — RPR: RPR Ser Ql: NONREACTIVE

## 2015-11-23 LAB — ANTIBODY SCREEN: Antibody Screen: NEGATIVE

## 2015-11-23 LAB — HEPATITIS B SURFACE ANTIGEN: HEP B S AG: NEGATIVE

## 2015-11-26 ENCOUNTER — Encounter: Payer: Self-pay | Admitting: Obstetrics and Gynecology

## 2015-11-26 DIAGNOSIS — Z3482 Encounter for supervision of other normal pregnancy, second trimester: Secondary | ICD-10-CM | POA: Insufficient documentation

## 2015-11-26 DIAGNOSIS — O09292 Supervision of pregnancy with other poor reproductive or obstetric history, second trimester: Secondary | ICD-10-CM | POA: Insufficient documentation

## 2015-11-26 DIAGNOSIS — O219 Vomiting of pregnancy, unspecified: Secondary | ICD-10-CM | POA: Insufficient documentation

## 2015-11-27 LAB — PAP IG, CT-NG NAA, HPV HIGH-RISK
CHLAMYDIA, NUC. ACID AMP: NEGATIVE
GONOCOCCUS BY NUCLEIC ACID AMP: NEGATIVE
HPV, HIGH-RISK: NEGATIVE
PAP Smear Comment: 0

## 2015-11-29 ENCOUNTER — Encounter: Payer: Self-pay | Admitting: Obstetrics and Gynecology

## 2015-12-01 ENCOUNTER — Telehealth: Payer: Self-pay

## 2015-12-01 NOTE — Telephone Encounter (Signed)
Pt calls back informed of normal panorama

## 2015-12-01 NOTE — Telephone Encounter (Signed)
Called pt, no answer, call dropped

## 2015-12-01 NOTE — Telephone Encounter (Signed)
-----   Message from Hildred LaserAnika Cherry, MD sent at 11/30/2015  8:36 AM EDT ----- Please inform of normal Panorama testing.  Is a female if desire to know gender.

## 2015-12-08 ENCOUNTER — Encounter: Payer: Self-pay | Admitting: Obstetrics and Gynecology

## 2015-12-28 ENCOUNTER — Ambulatory Visit (INDEPENDENT_AMBULATORY_CARE_PROVIDER_SITE_OTHER): Payer: BLUE CROSS/BLUE SHIELD | Admitting: Obstetrics and Gynecology

## 2015-12-28 VITALS — BP 109/64 | HR 89 | Wt 123.0 lb

## 2015-12-28 DIAGNOSIS — Z3482 Encounter for supervision of other normal pregnancy, second trimester: Secondary | ICD-10-CM

## 2015-12-28 LAB — POCT URINALYSIS DIPSTICK
BILIRUBIN UA: NEGATIVE
Glucose, UA: 250
KETONES UA: NEGATIVE
LEUKOCYTES UA: NEGATIVE
Nitrite, UA: NEGATIVE
PROTEIN UA: NEGATIVE
Spec Grav, UA: <=1.005
Urobilinogen, UA: NEGATIVE
pH, UA: 7

## 2015-12-28 NOTE — Progress Notes (Deleted)
ROB

## 2015-12-28 NOTE — Patient Instructions (Signed)
Second Trimester of Pregnancy The second trimester is from week 13 through week 28 (months 4 through 6). The second trimester is often a time when you feel your best. Your body has also adjusted to being pregnant, and you begin to feel better physically. Usually, morning sickness has lessened or quit completely, you may have more energy, and you may have an increase in appetite. The second trimester is also a time when the fetus is growing rapidly. At the end of the sixth month, the fetus is about 9 inches long and weighs about 1 pounds. You will likely begin to feel the baby move (quickening) between 18 and 20 weeks of the pregnancy. Body changes during your second trimester Your body continues to go through many changes during your second trimester. The changes vary from woman to woman.  Your weight will continue to increase. You will notice your lower abdomen bulging out.  You may begin to get stretch marks on your hips, abdomen, and breasts.  You may develop headaches that can be relieved by medicines. The medicines should be approved by your health care provider.  You may urinate more often because the fetus is pressing on your bladder.  You may develop or continue to have heartburn as a result of your pregnancy.  You may develop constipation because certain hormones are causing the muscles that push waste through your intestines to slow down.  You may develop hemorrhoids or swollen, bulging veins (varicose veins).  You may have back pain. This is caused by:  Weight gain.  Pregnancy hormones that are relaxing the joints in your pelvis.  A shift in weight and the muscles that support your balance.  Your breasts will continue to grow and they will continue to become tender.  Your gums may bleed and may be sensitive to brushing and flossing.  Dark spots or blotches (chloasma, mask of pregnancy) may develop on your face. This will likely fade after the baby is born.  A dark line  from your belly button to the pubic area (linea nigra) may appear. This will likely fade after the baby is born.  You may have changes in your hair. These can include thickening of your hair, rapid growth, and changes in texture. Some women also have hair loss during or after pregnancy, or hair that feels dry or thin. Your hair will most likely return to normal after your baby is born. What to expect at prenatal visits During a routine prenatal visit:  You will be weighed to make sure you and the fetus are growing normally.  Your blood pressure will be taken.  Your abdomen will be measured to track your baby's growth.  The fetal heartbeat will be listened to.  Any test results from the previous visit will be discussed. Your health care provider may ask you:  How you are feeling.  If you are feeling the baby move.  If you have had any abnormal symptoms, such as leaking fluid, bleeding, severe headaches, or abdominal cramping.  If you are using any tobacco products, including cigarettes, chewing tobacco, and electronic cigarettes.  If you have any questions. Other tests that may be performed during your second trimester include:  Blood tests that check for:  Low iron levels (anemia).  Gestational diabetes (between 24 and 28 weeks).  Rh antibodies. This is to check for a protein on red blood cells (Rh factor).  Urine tests to check for infections, diabetes, or protein in the urine.  An ultrasound to   confirm the proper growth and development of the baby.  An amniocentesis to check for possible genetic problems.  Fetal screens for spina bifida and Down syndrome.  HIV (human immunodeficiency virus) testing. Routine prenatal testing includes screening for HIV, unless you choose not to have this test. Follow these instructions at home: Eating and drinking  Continue to eat regular, healthy meals.  Avoid raw meat, uncooked cheese, cat litter boxes, and soil used by cats. These  carry germs that can cause birth defects in the baby.  Take your prenatal vitamins.  Take 1500-2000 mg of calcium daily starting at the 20th week of pregnancy until you deliver your baby.  If you develop constipation:  Take over-the-counter or prescription medicines.  Drink enough fluid to keep your urine clear or pale yellow.  Eat foods that are high in fiber, such as fresh fruits and vegetables, whole grains, and beans.  Limit foods that are high in fat and processed sugars, such as fried and sweet foods. Activity  Exercise only as directed by your health care provider. Experiencing uterine cramps is a good sign to stop exercising.  Avoid heavy lifting, wear low heel shoes, and practice good posture.  Wear your seat belt at all times when driving.  Rest with your legs elevated if you have leg cramps or low back pain.  Wear a good support bra for breast tenderness.  Do not use hot tubs, steam rooms, or saunas. Lifestyle  Avoid all smoking, herbs, alcohol, and unprescribed drugs. These chemicals affect the formation and growth of the baby.  Do not use any products that contain nicotine or tobacco, such as cigarettes and e-cigarettes. If you need help quitting, ask your health care provider.  A sexual relationship may be continued unless your health care provider directs you otherwise. General instructions  Follow your health care provider's instructions regarding medicine use. There are medicines that are either safe or unsafe to take during pregnancy.  Take warm sitz baths to soothe any pain or discomfort caused by hemorrhoids. Use hemorrhoid cream if your health care provider approves.  If you develop varicose veins, wear support hose. Elevate your feet for 15 minutes, 3-4 times a day. Limit salt in your diet.  Visit your dentist if you have not gone yet during your pregnancy. Use a soft toothbrush to brush your teeth and be gentle when you floss.  Keep all follow-up  prenatal visits as told by your health care provider. This is important. Contact a health care provider if:  You have dizziness.  You have mild pelvic cramps, pelvic pressure, or nagging pain in the abdominal area.  You have persistent nausea, vomiting, or diarrhea.  You have a bad smelling vaginal discharge.  You have pain with urination. Get help right away if:  You have a fever.  You are leaking fluid from your vagina.  You have spotting or bleeding from your vagina.  You have severe abdominal cramping or pain.  You have rapid weight gain or weight loss.  You have shortness of breath with chest pain.  You notice sudden or extreme swelling of your face, hands, ankles, feet, or legs.  You have not felt your baby move in over an hour.  You have severe headaches that do not go away with medicine.  You have vision changes. Summary  The second trimester is from week 13 through week 28 (months 4 through 6). It is also a time when the fetus is growing rapidly.  Your body goes   through many changes during pregnancy. The changes vary from woman to woman.  Avoid all smoking, herbs, alcohol, and unprescribed drugs. These chemicals affect the formation and growth your baby.  Do not use any tobacco products, such as cigarettes, chewing tobacco, and e-cigarettes. If you need help quitting, ask your health care provider.  Contact your health care provider if you have any questions. Keep all prenatal visits as told by your health care provider. This is important. This information is not intended to replace advice given to you by your health care provider. Make sure you discuss any questions you have with your health care provider. Document Released: 01/08/2001 Document Revised: 06/22/2015 Document Reviewed: 03/17/2012 Elsevier Interactive Patient Education  2017 [employer]  

## 2015-12-29 ENCOUNTER — Other Ambulatory Visit: Payer: BLUE CROSS/BLUE SHIELD

## 2015-12-31 NOTE — Progress Notes (Signed)
ROB: Doing well.  Notes URI symptoms last week, but improving.  For anatomy scan next visit.  RTC in 4 weeks. To offer flu vaccine at that time.

## 2016-01-25 ENCOUNTER — Ambulatory Visit (INDEPENDENT_AMBULATORY_CARE_PROVIDER_SITE_OTHER): Payer: BLUE CROSS/BLUE SHIELD

## 2016-01-25 ENCOUNTER — Encounter: Payer: Self-pay | Admitting: Obstetrics and Gynecology

## 2016-01-25 ENCOUNTER — Ambulatory Visit (INDEPENDENT_AMBULATORY_CARE_PROVIDER_SITE_OTHER): Payer: BLUE CROSS/BLUE SHIELD | Admitting: Obstetrics and Gynecology

## 2016-01-25 VITALS — BP 96/60 | HR 84 | Wt 128.4 lb

## 2016-01-25 DIAGNOSIS — Z3482 Encounter for supervision of other normal pregnancy, second trimester: Secondary | ICD-10-CM

## 2016-01-25 DIAGNOSIS — K219 Gastro-esophageal reflux disease without esophagitis: Secondary | ICD-10-CM

## 2016-01-25 DIAGNOSIS — O99619 Diseases of the digestive system complicating pregnancy, unspecified trimester: Secondary | ICD-10-CM

## 2016-01-25 LAB — POCT URINALYSIS DIPSTICK
BILIRUBIN UA: NEGATIVE
GLUCOSE UA: NEGATIVE
Ketones, UA: NEGATIVE
NITRITE UA: NEGATIVE
Protein, UA: NEGATIVE
Spec Grav, UA: 1.01
Urobilinogen, UA: NEGATIVE
pH, UA: 7.5

## 2016-01-25 NOTE — Progress Notes (Signed)
ROB: Patient noting reflux symptoms, advised on Tums. Notes nausea/vomiting has resolved.  S/p normal anatomy scan.  RTC in 4-5 weeks. Still debating about flu vaccine, to discuss again next visit.

## 2016-01-29 NOTE — L&D Delivery Note (Signed)
Delivery Summary for Stephanie Potter  Labor Events:   Preterm labor:   Rupture date:   Rupture time:   Rupture type: Artificial  Fluid Color: Light Meconium  Induction:   Augmentation:   Complications:   Cervical ripening:          Delivery:   Episiotomy:   Lacerations:   Repair suture:   Repair # of packets:   Blood loss (ml): 500    Information for the patient's newborn:  Stephanie Potter, Stephanie Potter [[id number]][[id number]]    Delivery 06/05/2016 2:35 PM by  Vaginal, Vacuum (Extractor) Sex:  female Gestational Age: 6656w0d Delivery Clinician:   Living?:         APGARS  One minute Five minutes Ten minutes  Skin color:        Heart rate:        Grimace:        Muscle tone:        Breathing:        Totals: 8  9      Presentation/position:      Resuscitation:   Cord information:    Disposition of cord blood:     Blood gases sent?  Complications:   Placenta: Delivered:       appearance Newborn Measurements: Weight: 6 lb 13.4 oz (3100 g)  Height: 20.47"  Head circumference:    Chest circumference:    Other providers:    Additional  information: Forceps:   Vacuum:   Breech:   Observed anomalies        Operative Delivery Note At 2:35 PM a viable female was delivered via Vaginal, Vacuum (Extractor).  Presentation: vertex; Position: Left,, Occiput,, Anterior; Station: +3.  Vacuum applied for non-reassuring fetal tracing, with recurrent deep variables while pushing, FHR decreasing to the 60s from baseline 140s with prolonged recovery phase .   Verbal consent: obtained from patient.  Risks and benefits discussed in detail.  Risks include, but are not limited to the risks of anesthesia, bleeding, infection, damage to maternal tissues, fetal cephalhematoma.  There is also the risk of inability to effect vaginal delivery of the head, or shoulder dystocia that cannot be resolved by established maneuvers, leading to the need for emergency cesarean section.  APGAR: 8, 9; weight 6 lb 13.4 oz (3100 g).    Placenta status: spontaneously removed, intact.   Cord: 3-vessel with the following complications: nuchal cord x 1, reduced at perineum.  Cord pH: not obtained.  Anesthesia:  Epidural Instruments: Kiwi vacuum Episiotomy: None Lacerations: 2nd degree;Vaginal;Perineal Suture Repair: 2.0 3.0 vicryl Est. Blood Loss (mL):  500   Mom to postpartum.  Baby to Couplet care / Skin to Skin.  Stephanie Potter 06/05/2016, 4:32 PM

## 2016-01-31 ENCOUNTER — Telehealth: Payer: Self-pay | Admitting: Obstetrics and Gynecology

## 2016-01-31 NOTE — Telephone Encounter (Signed)
Stephanie Potter said Dr Valentino Saxonherry could call and ask about abd pain. Usually comes in the evening and if she lays down it goes away. She is [redacted] wks pregnant and is worried something is wrong.

## 2016-01-31 NOTE — Telephone Encounter (Signed)
Called pt she states that she has been having mild abdominal pain x 3 days, pt states that the pain is worse in the evenings. Pt denies bleeding or leaking fluid, had BM today. Pt states that pain is on the right lower side only. Pt wanted your advise.

## 2016-01-31 NOTE — Telephone Encounter (Signed)
Advise patient on Tylenol as needed for pain.  If pain continues to worsen and is only on 1 side, would recommend patient coming in to be evaluated, and/or ultrasound to r/o ovarian cyst in pregnancy.

## 2016-02-01 NOTE — Telephone Encounter (Signed)
Called pt informed her of Dr.Cherry's advise below. Pt gave verbal understanding.

## 2016-02-29 ENCOUNTER — Ambulatory Visit (INDEPENDENT_AMBULATORY_CARE_PROVIDER_SITE_OTHER): Payer: BLUE CROSS/BLUE SHIELD | Admitting: Obstetrics and Gynecology

## 2016-02-29 VITALS — BP 97/58 | HR 87 | Wt 136.2 lb

## 2016-02-29 DIAGNOSIS — Z3482 Encounter for supervision of other normal pregnancy, second trimester: Secondary | ICD-10-CM | POA: Diagnosis not present

## 2016-02-29 DIAGNOSIS — R55 Syncope and collapse: Secondary | ICD-10-CM

## 2016-02-29 DIAGNOSIS — Z23 Encounter for immunization: Secondary | ICD-10-CM

## 2016-02-29 LAB — POCT URINALYSIS DIPSTICK
Bilirubin, UA: NEGATIVE
GLUCOSE UA: NEGATIVE
Ketones, UA: NEGATIVE
LEUKOCYTES UA: NEGATIVE
NITRITE UA: NEGATIVE
Spec Grav, UA: 1.02
UROBILINOGEN UA: NEGATIVE
pH, UA: 6.5

## 2016-02-29 NOTE — Progress Notes (Signed)
ROB: Patient doing well. Notes syncopal episode during a yoga class.  Patient with low normal BPs. Advised on caution when changing positions, increased hydration. Flu vaccine given today. RTC in 2 weeks, for 28 week labs at that time.

## 2016-03-01 DIAGNOSIS — Z23 Encounter for immunization: Secondary | ICD-10-CM

## 2016-03-12 ENCOUNTER — Encounter: Payer: Self-pay | Admitting: Obstetrics and Gynecology

## 2016-03-12 ENCOUNTER — Other Ambulatory Visit: Payer: BLUE CROSS/BLUE SHIELD

## 2016-03-12 ENCOUNTER — Ambulatory Visit (INDEPENDENT_AMBULATORY_CARE_PROVIDER_SITE_OTHER): Payer: BLUE CROSS/BLUE SHIELD | Admitting: Obstetrics and Gynecology

## 2016-03-12 ENCOUNTER — Other Ambulatory Visit: Payer: Self-pay | Admitting: Obstetrics and Gynecology

## 2016-03-12 VITALS — BP 100/64 | HR 87 | Wt 137.1 lb

## 2016-03-12 DIAGNOSIS — B359 Dermatophytosis, unspecified: Secondary | ICD-10-CM | POA: Insufficient documentation

## 2016-03-12 DIAGNOSIS — Z3483 Encounter for supervision of other normal pregnancy, third trimester: Secondary | ICD-10-CM

## 2016-03-12 LAB — POCT URINALYSIS DIPSTICK
Bilirubin, UA: NEGATIVE
Blood, UA: NEGATIVE
Glucose, UA: NEGATIVE
KETONES UA: NEGATIVE
LEUKOCYTES UA: NEGATIVE
Nitrite, UA: NEGATIVE
PROTEIN UA: NEGATIVE
SPEC GRAV UA: 1.01
Urobilinogen, UA: NEGATIVE
pH, UA: 6

## 2016-03-12 NOTE — Progress Notes (Signed)
1hr GCT today She complains of being tired but otherwise doing well. Active fetal movement daily. Small spot right upper quadrant consistent with ringworm.  We have discussed treatment with antifungal cream.

## 2016-03-12 NOTE — Addendum Note (Signed)
Addended by: Brooke DareSICK, Davie Sagona L on: 03/12/2016 09:22 AM   Modules accepted: Orders

## 2016-03-13 LAB — SPECIMEN STATUS REPORT

## 2016-03-13 LAB — GLUCOSE, 1 HOUR GESTATIONAL: GESTATIONAL DIABETES SCREEN: 114 mg/dL (ref 65–139)

## 2016-03-14 LAB — URINE CULTURE: Organism ID, Bacteria: NO GROWTH

## 2016-03-15 LAB — MONITOR DRUG PROFILE 14(MW)
Amphetamine Scrn, Ur: NEGATIVE ng/mL
BARBITURATE SCREEN URINE: NEGATIVE ng/mL
BENZODIAZEPINE SCREEN, URINE: NEGATIVE ng/mL
BUPRENORPHINE, URINE: NEGATIVE ng/mL
CANNABINOIDS UR QL SCN: NEGATIVE ng/mL
COCAINE(METAB.)SCREEN, URINE: NEGATIVE ng/mL
CREATININE(CRT), U: 156 mg/dL (ref 20.0–300.0)
FENTANYL, URINE: NEGATIVE pg/mL
MEPERIDINE SCREEN, URINE: NEGATIVE ng/mL
METHADONE SCREEN, URINE: NEGATIVE ng/mL
OXYCODONE+OXYMORPHONE UR QL SCN: NEGATIVE ng/mL
Opiate Scrn, Ur: NEGATIVE ng/mL
PHENCYCLIDINE QUANTITATIVE URINE: NEGATIVE ng/mL
Ph of Urine: 6.9 (ref 4.5–8.9)
Propoxyphene Scrn, Ur: NEGATIVE ng/mL
SPECIFIC GRAVITY: 1.022
Tramadol Screen, Urine: NEGATIVE ng/mL

## 2016-03-15 LAB — GC/CHLAMYDIA PROBE AMP
CHLAMYDIA, DNA PROBE: NEGATIVE
Neisseria gonorrhoeae by PCR: NEGATIVE

## 2016-03-27 ENCOUNTER — Telehealth: Payer: Self-pay

## 2016-03-27 NOTE — Telephone Encounter (Signed)
Informed patient of neg results per provider. 

## 2016-03-27 NOTE — Telephone Encounter (Signed)
-----   Message from Linzie Collinavid James Evans, MD sent at 03/27/2016  3:29 PM EST ----- All results within normal range.

## 2016-04-02 ENCOUNTER — Encounter: Payer: BLUE CROSS/BLUE SHIELD | Admitting: Obstetrics and Gynecology

## 2016-04-02 ENCOUNTER — Ambulatory Visit (INDEPENDENT_AMBULATORY_CARE_PROVIDER_SITE_OTHER): Payer: Medicaid Other | Admitting: Obstetrics and Gynecology

## 2016-04-02 VITALS — BP 96/55 | HR 75 | Wt 142.1 lb

## 2016-04-02 DIAGNOSIS — Z3483 Encounter for supervision of other normal pregnancy, third trimester: Secondary | ICD-10-CM

## 2016-04-02 LAB — POCT URINALYSIS DIPSTICK
Bilirubin, UA: NEGATIVE
Glucose, UA: NEGATIVE
KETONES UA: NEGATIVE
Nitrite, UA: NEGATIVE
PH UA: 8
PROTEIN UA: NEGATIVE
SPEC GRAV UA: 1.01
Urobilinogen, UA: NEGATIVE

## 2016-04-02 NOTE — Progress Notes (Signed)
ROB: Doing well.  Notes swelling of hands and feet. Discussed elevation of legs, compression stockings, hand braces for carpal tunnel symptoms.  Normal glucola screen. RTC in 2 weeks.

## 2016-04-02 NOTE — Progress Notes (Deleted)
ROB: Doing 

## 2016-04-16 ENCOUNTER — Encounter: Payer: Self-pay | Admitting: Obstetrics and Gynecology

## 2016-04-16 ENCOUNTER — Ambulatory Visit (INDEPENDENT_AMBULATORY_CARE_PROVIDER_SITE_OTHER): Payer: Medicaid Other | Admitting: Obstetrics and Gynecology

## 2016-04-16 VITALS — BP 94/59 | HR 76 | Wt 146.6 lb

## 2016-04-16 DIAGNOSIS — Z23 Encounter for immunization: Secondary | ICD-10-CM | POA: Diagnosis not present

## 2016-04-16 DIAGNOSIS — Z3493 Encounter for supervision of normal pregnancy, unspecified, third trimester: Secondary | ICD-10-CM

## 2016-04-16 LAB — POCT URINALYSIS DIPSTICK
Bilirubin, UA: NEGATIVE
Glucose, UA: NEGATIVE
Ketones, UA: NEGATIVE
Nitrite, UA: NEGATIVE
PH UA: 5 (ref 5.0–8.0)
PROTEIN UA: NEGATIVE
RBC UA: NEGATIVE
SPEC GRAV UA: 1.02 (ref 1.030–1.035)
UROBILINOGEN UA: NEGATIVE (ref ?–2.0)

## 2016-04-16 NOTE — Progress Notes (Signed)
Patient has no complaints today. She used antifungals and the ringworm is resolved.  Complaints of some swelling in her feet and stiffness in her fingers. Occasional numbness-we discussed edema of pregnancy and carpal tunnel syndrome. H&H today.  Tdap today.

## 2016-04-17 ENCOUNTER — Telehealth: Payer: Self-pay | Admitting: *Deleted

## 2016-04-17 DIAGNOSIS — Z111 Encounter for screening for respiratory tuberculosis: Secondary | ICD-10-CM

## 2016-04-17 LAB — HEMOGLOBIN AND HEMATOCRIT, BLOOD
HEMATOCRIT: 33.6 % — AB (ref 34.0–46.6)
HEMOGLOBIN: 11.3 g/dL (ref 11.1–15.9)

## 2016-04-17 NOTE — Telephone Encounter (Signed)
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 Patient is requesting a call back . Her number is [phone]. Please advise . Thank you

## 2016-04-18 NOTE — Telephone Encounter (Signed)
Called pt she states that she needs a TB test for college, advised pt that she cannot have skin test however can have blood work done. Order placed, pt to come in tomorrow for lab draw. Pt added to schedule.

## 2016-04-19 ENCOUNTER — Other Ambulatory Visit: Payer: Medicaid Other

## 2016-04-19 DIAGNOSIS — Z111 Encounter for screening for respiratory tuberculosis: Secondary | ICD-10-CM

## 2016-04-23 LAB — QUANTIFERON IN TUBE
QFT TB AG MINUS NIL VALUE: <0 IU/mL
QUANTIFERON MITOGEN VALUE: 1.06 IU/mL
QUANTIFERON NIL VALUE: 0.03 [IU]/mL
QUANTIFERON TB AG VALUE: 0.02 [IU]/mL
QUANTIFERON TB GOLD: NEGATIVE

## 2016-04-23 LAB — QUANTIFERON TB GOLD ASSAY (BLOOD)

## 2016-04-25 ENCOUNTER — Telehealth: Payer: Self-pay | Admitting: *Deleted

## 2016-04-25 NOTE — Telephone Encounter (Signed)
Patient called and states she got her TB test done on 04/19/16. She is inquiring about her results. She is requesting a call back. Her number is [phone]. Please advise. Thank you

## 2016-04-30 ENCOUNTER — Encounter: Payer: Self-pay | Admitting: Obstetrics and Gynecology

## 2016-04-30 ENCOUNTER — Ambulatory Visit (INDEPENDENT_AMBULATORY_CARE_PROVIDER_SITE_OTHER): Payer: Medicaid Other | Admitting: Obstetrics and Gynecology

## 2016-04-30 VITALS — BP 99/57 | HR 89 | Wt 148.6 lb

## 2016-04-30 DIAGNOSIS — Z3493 Encounter for supervision of normal pregnancy, unspecified, third trimester: Secondary | ICD-10-CM

## 2016-04-30 LAB — POCT URINALYSIS DIPSTICK
Bilirubin, UA: NEGATIVE
Blood, UA: NEGATIVE
GLUCOSE UA: NEGATIVE
KETONES UA: NEGATIVE
Leukocytes, UA: NEGATIVE
Nitrite, UA: NEGATIVE
Protein, UA: NEGATIVE
SPEC GRAV UA: 1.01 (ref 1.030–1.035)
Urobilinogen, UA: NEGATIVE (ref ?–2.0)
pH, UA: 7.5 (ref 5.0–8.0)

## 2016-04-30 NOTE — Progress Notes (Signed)
ROB:  Pt c/o occ lightheadedness that she associates with meals.  Feeling "tired".  We discussed hydration and trying frequent small meals instead of 3 large meals.

## 2016-05-09 ENCOUNTER — Ambulatory Visit (INDEPENDENT_AMBULATORY_CARE_PROVIDER_SITE_OTHER): Payer: Medicaid Other | Admitting: Obstetrics and Gynecology

## 2016-05-09 VITALS — BP 90/54 | HR 84 | Wt 150.1 lb

## 2016-05-09 DIAGNOSIS — Z3483 Encounter for supervision of other normal pregnancy, third trimester: Secondary | ICD-10-CM

## 2016-05-09 DIAGNOSIS — Z113 Encounter for screening for infections with a predominantly sexual mode of transmission: Secondary | ICD-10-CM

## 2016-05-09 DIAGNOSIS — Z3685 Encounter for antenatal screening for Streptococcus B: Secondary | ICD-10-CM

## 2016-05-09 LAB — OB RESULTS CONSOLE GBS: GBS: NEGATIVE

## 2016-05-09 LAB — POCT URINALYSIS DIPSTICK
Bilirubin, UA: NEGATIVE
Glucose, UA: NEGATIVE
Ketones, UA: NEGATIVE
Nitrite, UA: NEGATIVE
SPEC GRAV UA: 1.025 (ref 1.010–1.025)
UROBILINOGEN UA: 0.2 U/dL
pH, UA: 6.5 (ref 5.0–8.0)

## 2016-05-09 NOTE — Patient Instructions (Signed)

## 2016-05-09 NOTE — Progress Notes (Signed)
ROB: Patient doing well, denies complaints. Reviewed labor precautions. 36 week labs done today. RTC in 1 week.

## 2016-05-11 LAB — GC/CHLAMYDIA PROBE AMP
CHLAMYDIA, DNA PROBE: NEGATIVE
Neisseria gonorrhoeae by PCR: NEGATIVE

## 2016-05-13 LAB — CULTURE, BETA STREP (GROUP B ONLY): STREP GP B CULTURE: NEGATIVE

## 2016-05-17 ENCOUNTER — Encounter: Payer: Medicaid Other | Admitting: Obstetrics and Gynecology

## 2016-05-22 ENCOUNTER — Ambulatory Visit (INDEPENDENT_AMBULATORY_CARE_PROVIDER_SITE_OTHER): Payer: Medicaid Other | Admitting: Obstetrics and Gynecology

## 2016-05-22 ENCOUNTER — Encounter: Payer: Self-pay | Admitting: Obstetrics and Gynecology

## 2016-05-22 VITALS — BP 110/70 | HR 73 | Wt 152.4 lb

## 2016-05-22 DIAGNOSIS — Z3493 Encounter for supervision of normal pregnancy, unspecified, third trimester: Secondary | ICD-10-CM

## 2016-05-22 LAB — POCT URINALYSIS DIPSTICK
BILIRUBIN UA: NEGATIVE
GLUCOSE UA: NEGATIVE
KETONES UA: NEGATIVE
Leukocytes, UA: NEGATIVE
Nitrite, UA: NEGATIVE
Protein, UA: NEGATIVE
RBC UA: NEGATIVE
SPEC GRAV UA: 1.025 (ref 1.010–1.025)
Urobilinogen, UA: NEGATIVE E.U./dL — AB
pH, UA: 5 (ref 5.0–8.0)

## 2016-05-22 NOTE — Addendum Note (Signed)
Addended by: Brooke Dare on: 05/22/2016 10:40 AM   Modules accepted: Orders

## 2016-05-22 NOTE — Progress Notes (Signed)
ROB:  No problems.  Urine for cx for lg leuk without symptoms.  Pt refused vaginal exam.

## 2016-05-24 LAB — URINE CULTURE

## 2016-05-29 ENCOUNTER — Ambulatory Visit (INDEPENDENT_AMBULATORY_CARE_PROVIDER_SITE_OTHER): Payer: Medicaid Other | Admitting: Obstetrics and Gynecology

## 2016-05-29 VITALS — BP 113/67 | HR 79 | Wt 156.0 lb

## 2016-05-29 DIAGNOSIS — O479 False labor, unspecified: Secondary | ICD-10-CM

## 2016-05-29 DIAGNOSIS — Z3483 Encounter for supervision of other normal pregnancy, third trimester: Secondary | ICD-10-CM

## 2016-05-29 LAB — POCT URINALYSIS DIPSTICK
BILIRUBIN UA: NEGATIVE
GLUCOSE UA: NEGATIVE
KETONES UA: NEGATIVE
NITRITE UA: NEGATIVE
Protein, UA: NEGATIVE
RBC UA: NEGATIVE
Spec Grav, UA: 1.01 (ref 1.010–1.025)
Urobilinogen, UA: 0.2 E.U./dL
pH, UA: 6.5 (ref 5.0–8.0)

## 2016-05-29 NOTE — Progress Notes (Signed)
ROB: Patient doing well, no complaints. Notes Deberah Pelton. Labor precautions reiterated. RTC in 1 week if undelivered.

## 2016-06-05 ENCOUNTER — Encounter: Payer: Medicaid Other | Admitting: Obstetrics and Gynecology

## 2016-06-05 ENCOUNTER — Inpatient Hospital Stay: Payer: Medicaid Other | Admitting: Registered Nurse

## 2016-06-05 ENCOUNTER — Inpatient Hospital Stay
Admission: EM | Admit: 2016-06-05 | Discharge: 2016-06-07 | DRG: 775 | Disposition: A | Discharge status: Home / Self Care | Payer: Medicaid Other | Attending: Obstetrics and Gynecology | Admitting: Obstetrics and Gynecology

## 2016-06-05 ENCOUNTER — Encounter: Payer: Self-pay | Admitting: Obstetrics and Gynecology

## 2016-06-05 DIAGNOSIS — Z3A40 40 weeks gestation of pregnancy: Secondary | ICD-10-CM

## 2016-06-05 DIAGNOSIS — Z3493 Encounter for supervision of normal pregnancy, unspecified, third trimester: Secondary | ICD-10-CM | POA: Diagnosis present

## 2016-06-05 HISTORY — DX: Other specified health status: Z78.9

## 2016-06-05 LAB — TYPE AND SCREEN
ABO/RH(D): B POS
Antibody Screen: NEGATIVE

## 2016-06-05 LAB — CBC
HCT: 37.4 % (ref 35.0–47.0)
HEMOGLOBIN: 12.9 g/dL (ref 12.0–16.0)
MCH: 32.1 pg (ref 26.0–34.0)
MCHC: 34.5 g/dL (ref 32.0–36.0)
MCV: 92.8 fL (ref 80.0–100.0)
PLATELETS: 242 10*3/uL (ref 150–440)
RBC: 4.03 MIL/uL (ref 3.80–5.20)
RDW: 13.6 % (ref 11.5–14.5)
WBC: 9.7 10*3/uL (ref 3.6–11.0)

## 2016-06-05 LAB — RAPID HIV SCREEN (HIV 1/2 AB+AG)
HIV 1/2 Antibodies: NONREACTIVE
HIV-1 P24 Antigen - HIV24: NONREACTIVE

## 2016-06-05 MED ORDER — BENZOCAINE-MENTHOL 20-0.5 % EX AERO
1.0000 "application " | INHALATION_SPRAY | CUTANEOUS | Status: DC | PRN
  Filled 2016-06-05 (×2): qty 56

## 2016-06-05 MED ORDER — SOD CITRATE-CITRIC ACID 500-334 MG/5ML PO SOLN: 30.0000 mL | ORAL | Status: DC | PRN

## 2016-06-05 MED ORDER — LIDOCAINE HCL (PF) 1 % IJ SOLN
INTRAMUSCULAR | Status: DC | PRN
  Administered 2016-06-05: 3 mL

## 2016-06-05 MED ORDER — ONDANSETRON HCL 4 MG/2ML IJ SOLN: 4.0000 mg | INTRAMUSCULAR | Status: DC | PRN

## 2016-06-05 MED ORDER — KETOROLAC TROMETHAMINE 30 MG/ML IJ SOLN: 30.0000 mg | Freq: Four times a day (QID) | INTRAMUSCULAR | Status: DC | PRN

## 2016-06-05 MED ORDER — LIDOCAINE HCL (PF) 1 % IJ SOLN: 30.0000 mL | INTRAMUSCULAR | Status: DC | PRN

## 2016-06-05 MED ORDER — OXYTOCIN 10 UNIT/ML IJ SOLN
INTRAMUSCULAR | Status: AC
  Filled 2016-06-05: qty 2

## 2016-06-05 MED ORDER — AMMONIA AROMATIC IN INHA
RESPIRATORY_TRACT | Status: AC
  Filled 2016-06-05: qty 10

## 2016-06-05 MED ORDER — BUTORPHANOL TARTRATE 2 MG/ML IJ SOLN: 1.0000 mg | INTRAMUSCULAR | Status: DC | PRN

## 2016-06-05 MED ORDER — HYDROCODONE-ACETAMINOPHEN 5-325 MG PO TABS: 1.0000 | ORAL_TABLET | ORAL | Status: DC | PRN

## 2016-06-05 MED ORDER — MEPERIDINE HCL 25 MG/ML IJ SOLN: 6.2500 mg | INTRAMUSCULAR | Status: DC | PRN

## 2016-06-05 MED ORDER — LIDOCAINE-EPINEPHRINE (PF) 1.5 %-1:200000 IJ SOLN
INTRAMUSCULAR | Status: DC | PRN
  Administered 2016-06-05: 3 mL via PERINEURAL

## 2016-06-05 MED ORDER — BUPIVACAINE HCL (PF) 0.25 % IJ SOLN
INTRAMUSCULAR | Status: DC | PRN
  Administered 2016-06-05 (×2): 3 mL via EPIDURAL

## 2016-06-05 MED ORDER — OXYCODONE-ACETAMINOPHEN 5-325 MG PO TABS: 2.0000 | ORAL_TABLET | ORAL | Status: DC | PRN

## 2016-06-05 MED ORDER — OXYTOCIN BOLUS FROM INFUSION
500.0000 mL | Freq: Once | INTRAVENOUS | Status: AC
  Administered 2016-06-05: 500 mL via INTRAVENOUS

## 2016-06-05 MED ORDER — NALOXONE HCL 2 MG/2ML IJ SOSY
1.0000 ug/kg/h | PREFILLED_SYRINGE | INTRAVENOUS | Status: DC | PRN
  Filled 2016-06-05: qty 2

## 2016-06-05 MED ORDER — LACTATED RINGERS IV SOLN: 500.0000 mL | INTRAVENOUS | Status: DC | PRN

## 2016-06-05 MED ORDER — METHYLERGONOVINE MALEATE 0.2 MG PO TABS
0.2000 mg | ORAL_TABLET | ORAL | Status: DC | PRN
  Filled 2016-06-05: qty 1

## 2016-06-05 MED ORDER — ONDANSETRON HCL 4 MG/2ML IJ SOLN: 4.0000 mg | Freq: Four times a day (QID) | INTRAMUSCULAR | Status: DC | PRN

## 2016-06-05 MED ORDER — NALOXONE HCL 0.4 MG/ML IJ SOLN: 0.4000 mg | INTRAMUSCULAR | Status: DC | PRN

## 2016-06-05 MED ORDER — NALBUPHINE HCL 10 MG/ML IJ SOLN: 5.0000 mg | Freq: Once | INTRAMUSCULAR | Status: DC | PRN

## 2016-06-05 MED ORDER — LIDOCAINE HCL (PF) 1 % IJ SOLN
INTRAMUSCULAR | Status: AC
  Filled 2016-06-05: qty 30

## 2016-06-05 MED ORDER — KETOROLAC TROMETHAMINE 30 MG/ML IJ SOLN
30.0000 mg | Freq: Four times a day (QID) | INTRAMUSCULAR | Status: DC | PRN
  Administered 2016-06-05: 30 mg via INTRAVENOUS
  Filled 2016-06-05: qty 1

## 2016-06-05 MED ORDER — PRENATAL MULTIVITAMIN CH
1.0000 | ORAL_TABLET | Freq: Every day | ORAL | Status: DC
  Administered 2016-06-06 – 2016-06-07 (×2): 1 via ORAL
  Filled 2016-06-05 (×2): qty 1

## 2016-06-05 MED ORDER — SIMETHICONE 80 MG PO CHEW: 80.0000 mg | CHEWABLE_TABLET | ORAL | Status: DC | PRN

## 2016-06-05 MED ORDER — FENTANYL 2.5 MCG/ML W/ROPIVACAINE 0.2% IN NS 100 ML EPIDURAL INFUSION (ARMC-ANES)
EPIDURAL | Status: DC | PRN
  Administered 2016-06-05: 10 mL/h via EPIDURAL

## 2016-06-05 MED ORDER — ACETAMINOPHEN 325 MG PO TABS
650.0000 mg | ORAL_TABLET | ORAL | Status: DC | PRN
  Administered 2016-06-05 – 2016-06-06 (×2): 650 mg via ORAL
  Filled 2016-06-05 (×2): qty 2

## 2016-06-05 MED ORDER — DIPHENHYDRAMINE HCL 25 MG PO CAPS: 25.0000 mg | ORAL_CAPSULE | Freq: Four times a day (QID) | ORAL | Status: DC | PRN

## 2016-06-05 MED ORDER — FENTANYL 2.5 MCG/ML W/ROPIVACAINE 0.2% IN NS 100 ML EPIDURAL INFUSION (ARMC-ANES)
10.0000 mL/h | EPIDURAL | Status: DC
  Filled 2016-06-05: qty 100

## 2016-06-05 MED ORDER — ACETAMINOPHEN 325 MG PO TABS: 650.0000 mg | ORAL_TABLET | ORAL | Status: DC | PRN

## 2016-06-05 MED ORDER — FENTANYL 2.5 MCG/ML W/ROPIVACAINE 0.2% IN NS 100 ML EPIDURAL INFUSION (ARMC-ANES)
EPIDURAL | Status: AC
  Filled 2016-06-05: qty 100

## 2016-06-05 MED ORDER — NALBUPHINE HCL 10 MG/ML IJ SOLN: 5.0000 mg | INTRAMUSCULAR | Status: DC | PRN

## 2016-06-05 MED ORDER — IBUPROFEN 600 MG PO TABS
600.0000 mg | ORAL_TABLET | Freq: Four times a day (QID) | ORAL | Status: DC
  Administered 2016-06-05 – 2016-06-07 (×7): 600 mg via ORAL
  Filled 2016-06-05 (×7): qty 1

## 2016-06-05 MED ORDER — DIPHENHYDRAMINE HCL 50 MG/ML IJ SOLN: 12.5000 mg | INTRAMUSCULAR | Status: DC | PRN

## 2016-06-05 MED ORDER — SODIUM CHLORIDE 0.9% FLUSH: 3.0000 mL | INTRAVENOUS | Status: DC | PRN

## 2016-06-05 MED ORDER — DIBUCAINE 1 % RE OINT: 1.0000 "application " | TOPICAL_OINTMENT | RECTAL | Status: DC | PRN

## 2016-06-05 MED ORDER — SENNOSIDES-DOCUSATE SODIUM 8.6-50 MG PO TABS
2.0000 | ORAL_TABLET | ORAL | Status: DC
  Administered 2016-06-06: 2 via ORAL
  Filled 2016-06-05: qty 2

## 2016-06-05 MED ORDER — DIPHENHYDRAMINE HCL 25 MG PO CAPS: 25.0000 mg | ORAL_CAPSULE | ORAL | Status: DC | PRN

## 2016-06-05 MED ORDER — OXYTOCIN 40 UNITS IN LACTATED RINGERS INFUSION - SIMPLE MED
2.5000 [IU]/h | INTRAVENOUS | Status: DC
  Filled 2016-06-05: qty 1000

## 2016-06-05 MED ORDER — METHYLERGONOVINE MALEATE 0.2 MG/ML IJ SOLN: 0.2000 mg | INTRAMUSCULAR | Status: DC | PRN

## 2016-06-05 MED ORDER — COCONUT OIL OIL
1.0000 "application " | TOPICAL_OIL | Status: DC | PRN
  Administered 2016-06-06: 1 via TOPICAL
  Filled 2016-06-05: qty 120

## 2016-06-05 MED ORDER — TETANUS-DIPHTH-ACELL PERTUSSIS 5-2.5-18.5 LF-MCG/0.5 IM SUSP: 0.5000 mL | Freq: Once | INTRAMUSCULAR | Status: DC

## 2016-06-05 MED ORDER — LACTATED RINGERS IV SOLN
INTRAVENOUS | Status: DC
  Administered 2016-06-05 (×2): via INTRAVENOUS

## 2016-06-05 MED ORDER — ONDANSETRON HCL 4 MG/2ML IJ SOLN: 4.0000 mg | Freq: Three times a day (TID) | INTRAMUSCULAR | Status: DC | PRN

## 2016-06-05 MED ORDER — WITCH HAZEL-GLYCERIN EX PADS: 1.0000 "application " | MEDICATED_PAD | CUTANEOUS | Status: DC | PRN

## 2016-06-05 MED ORDER — OXYCODONE-ACETAMINOPHEN 5-325 MG PO TABS: 1.0000 | ORAL_TABLET | ORAL | Status: DC | PRN

## 2016-06-05 MED ORDER — ZOLPIDEM TARTRATE 5 MG PO TABS: 5.0000 mg | ORAL_TABLET | Freq: Every evening | ORAL | Status: DC | PRN

## 2016-06-05 MED ORDER — MISOPROSTOL 200 MCG PO TABS
ORAL_TABLET | ORAL | Status: AC
  Filled 2016-06-05: qty 4

## 2016-06-05 MED ORDER — ONDANSETRON HCL 4 MG PO TABS: 4.0000 mg | ORAL_TABLET | ORAL | Status: DC | PRN

## 2016-06-05 NOTE — Anesthesia Procedure Notes (Signed)
Epidural Patient location during procedure: OB Start time: 06/05/2016 11:05 AM End time: 06/05/2016 11:12 AM  Staffing Anesthesiologist: Lenard SimmerKARENZ, ANDREW Resident/CRNA: Stormy FabianURTIS, Tecia Cinnamon Performed: resident/CRNA   Preanesthetic Checklist Completed: patient identified, site marked, surgical consent, pre-op evaluation, timeout performed, IV checked, risks and benefits discussed and monitors and equipment checked  Epidural Patient position: sitting Prep: Betadine Patient monitoring: heart rate, continuous pulse ox and blood pressure Approach: midline Location: L4-L5 Injection technique: LOR saline  Needle:  Needle type: Tuohy  Needle gauge: 17 G Needle length: 9 cm and 9 Needle insertion depth: 5.5 cm Catheter type: closed end flexible Catheter size: 19 Gauge Catheter at skin depth: 11 cm Test dose: negative and 1.5% lidocaine with Epi 1:200 K  Assessment Sensory level: T10 Events: blood not aspirated, injection not painful, no injection resistance, negative IV test and no paresthesia  Additional Notes Pt. Evaluated and documentation done after procedure finished. Patient identified. Risks/Benefits/Options discussed with patient including but not limited to bleeding, infection, nerve damage, paralysis, failed block, incomplete pain control, headache, blood pressure changes, nausea, vomiting, reactions to medication both or allergic, itching and postpartum back pain. Confirmed with bedside nurse the patient's most recent platelet count. Confirmed with patient that they are not currently taking any anticoagulation, have any bleeding history or any family history of bleeding disorders. Patient expressed understanding and wished to proceed. All questions were answered. Sterile technique was used throughout the entire procedure. Please see nursing notes for vital signs. Test dose was given through epidural catheter and negative prior to continuing to dose epidural or start infusion. Warning signs  of high block given to the patient including shortness of breath, tingling/numbness in hands, complete motor block, or any concerning symptoms with instructions to call for help. Patient was given instructions on fall risk and not to get out of bed. All questions and concerns addressed with instructions to call with any issues or inadequate analgesia.   Patient tolerated the insertion well without immediate complications.Reason for block:procedure for pain

## 2016-06-05 NOTE — H&P (Signed)
Obstetric History and Physical  Stephanie Potter is a 31 y.o. G2P0010 with IUP at 7739w0d presenting for complaints of contractions. Patient states she has been having irregular contractions since last night, becoming more regular and painful this morning.  Denies vaginal bleeding, unsure of ruptured membranes (patient states she heard a pop, but never felt a gush), with active fetal movement.    Prenatal Course Source of Care: [facility] with onset of care at 12 weeks Pregnancy complications or risks: Patient Active Problem List   Diagnosis Date Noted  . Labor and delivery indication for care or intervention 06/05/2016  . Encounter for supervision of other normal pregnancy, third trimester 03/12/2016  . Ringworm 03/12/2016  . Gastroesophageal reflux in pregnancy 01/25/2016  . Nausea and vomiting during pregnancy 11/26/2015  . History of miscarriage, currently pregnant, second trimester 11/26/2015   She plans to breastfeed She desires oral contraceptives (estrogen/progesterone) for postpartum contraception.   Prenatal labs and studies: ABO, Rh: B/Positive/-- (10/25 1629) Antibody: Negative (10/25 1629) Rubella: 6.65 (10/25 1629) RPR: Non Reactive (10/25 1629)  HBsAg: Negative (10/25 1629)  HIV: Non Reactive (10/25 1629)  UXL:KGMWNUUVGBS:Negative (04/12 0000) 1 hr Glucola  Normal  Genetic screening normal (Panorama) Anatomy US normal  Past Medical History:  Diagnosis Date  . Medical history non-contributory     Past Surgical History:  Procedure Laterality Date  . CYSTECTOMY    . LASIK      OB History  Gravida Para Term Preterm AB Living  2       1    SAB TAB Ectopic Multiple Live Births  1            # Outcome Date GA Lbr Len/2nd Weight Sex Delivery Anes PTL Lv  2 Current           1 SAB 2016        ND    Obstetric Comments  early    Social History   Social History  . Marital status: Married    Spouse name: N/A  . Number of children: N/A  . Years of education:  N/A   Social History Main Topics  . Smoking status: Never Smoker  . Smokeless tobacco: Never Used  . Alcohol use No  . Drug use: No  . Sexual activity: Yes    Partners: Male    Birth control/ protection: None   Other Topics Concern  . Not on file   Social History Narrative  . No narrative on file    No family history on file.  Prescriptions Prior to Admission  Medication Sig Dispense Refill Last Dose  . Prenatal Vit-Fe Fumarate-FA (PRENATAL 1 PLUS 1 PO) Take by mouth.   Taking    No Known Allergies  Review of Systems: Negative except for what is mentioned in HPI.  Physical Exam: BP 120/76   Pulse 80   LMP 05/23/2015 (Exact Date) Comment: Irregular menses CONSTITUTIONAL: Well-developed, well-nourished female in no acute distress.  HENT:  Normocephalic, atraumatic, External right and left ear normal. Oropharynx is clear and moist EYES: Conjunctivae and EOM are normal. Pupils are equal, round, and reactive to light. No scleral icterus.  NECK: Normal range of motion, supple, no masses SKIN: Skin is warm and dry. No rash noted. Not diaphoretic. No erythema. No pallor. NEUROLOGIC: Alert and oriented to person, place, and time. Normal reflexes, muscle tone coordination. No cranial nerve deficit noted. PSYCHIATRIC: Normal mood and affect. Normal behavior. Normal judgment and thought content. CARDIOVASCULAR: Normal heart  rate noted, regular rhythm RESPIRATORY: Effort and breath sounds normal, no problems with respiration noted ABDOMEN: Soft, nontender, nondistended, gravid. MUSCULOSKELETAL: Normal range of motion. No edema and no tenderness. 2+ distal pulses.  Cervical Exam: Dilatation 5.5 cm   Effacement 80%   Station 0   Presentation: cephalic FHT:  Baseline rate 145 bpm   Variability moderate  Accelerations present   Decelerations none Contractions: Every 3-4 mins   Pertinent Labs/Studies:   Results for orders placed or performed during the hospital encounter of 06/05/16  (from the past 24 hour(s))  CBC     Status: None   Collection Time: 06/05/16  9:30 AM  Result Value Ref Range   WBC 9.7 3.6 - 11.0 K/uL   RBC 4.03 3.80 - 5.20 MIL/uL   Hemoglobin 12.9 12.0 - 16.0 g/dL   HCT 16.1 09.6 - 04.5 %   MCV 92.8 80.0 - 100.0 fL   MCH 32.1 26.0 - 34.0 pg   MCHC 34.5 32.0 - 36.0 g/dL   RDW 40.9 81.1 - 91.4 %   Platelets 242 150 - 440 K/uL  Type and screen [facility]     Status: None (Preliminary result)   Collection Time: 06/05/16  9:30 AM  Result Value Ref Range   ABO/RH(D) PENDING    Antibody Screen PENDING    Sample Expiration 06/08/2016   Rapid HIV screen (HIV 1/2 Ab+Ag) ([facility] Only)     Status: None   Collection Time: 06/05/16  9:30 AM  Result Value Ref Range   HIV-1 P24 Antigen - HIV24 NON REACTIVE NON REACTIVE   HIV 1/2 Antibodies NON REACTIVE NON REACTIVE   Interpretation (HIV Ag Ab)      A non reactive test result means that HIV 1 or HIV 2 antibodies and HIV 1 p24 antigen were not detected in the specimen.  Type and screen     Status: None   Collection Time: 06/05/16 10:01 AM  Result Value Ref Range   ABO/RH(D) B POS    Antibody Screen NEG    Sample Expiration 06/08/2016     Assessment : Malavika Panchal is a 31 y.o. G2P0010 at [redacted]w[redacted]d being admitted for labor.  Plan: Labor: Expectant management for now.  Augmentation as needed, per protocol. Analgesia as needed. FWB: Reassuring fetal heart tracing.  GBS negative Delivery plan: Hopeful for vaginal delivery   Hildred Laser, MD [facility]

## 2016-06-05 NOTE — Progress Notes (Signed)
Called by nurse to assess patient for late deceleration from baseline of 140s down to 60s, lasting 2 minutes.    Intrapartum Progress Note  S: Patient complains of pain.   O: Blood pressure 129/81, pulse 75, temperature 98 F (36.7 C), temperature source Oral, resp. rate 18, last menstrual period 05/23/2015, unknown if currently breastfeeding. Gen App: NAD, uncomfortable Abdomen: soft, gravid FHT: baseline 140 bpm.  Accels present.  Decels present - prolonged deceleration from baseline 140s to 60s, lasting total of 7 minutes. moderate in degree variability.   Tocometer: contractions q 2-3 minutes Cervix: 9.5/90/0 to +1 station.  Also with possible SROM (per patient). Blood show present at perineum. Extremities: Nontender, no edema.  Labs: No new labs  Assessment:  1: SIUP at 3636w0d 2. Active labor 3. Category II tracing, now resolved after resuscitative measures (facemask O2, left and right lateral tilt, and hands/knees positioning).   Plan:  1. Continue expectant management.  Category II tracing likely secondary to rapid cervical change and possible SROM.  2. Patient desires epidural, Anesthesia present, ready to place.  3. Anticipate vaginal delivery soon.    Hildred Laserherry, Briscoe Daniello, MD 06/05/2016 11:21 AM

## 2016-06-05 NOTE — Anesthesia Preprocedure Evaluation (Signed)
Anesthesia Evaluation  Patient identified by MRN, date of birth, ID band Patient awake    Reviewed: Allergy & Precautions, H&P , NPO status , Patient's Chart, lab work & pertinent test results  History of Anesthesia Complications Negative for: history of anesthetic complications  Airway Mallampati: II       Dental no notable dental hx.    Pulmonary neg pulmonary ROS,    Pulmonary exam normal        Cardiovascular negative cardio ROS Normal cardiovascular exam     Neuro/Psych negative neurological ROS  negative psych ROS   GI/Hepatic negative GI ROS, Neg liver ROS,   Endo/Other  negative endocrine ROS  Renal/GU negative Renal ROS  negative genitourinary   Musculoskeletal   Abdominal   Peds  Hematology negative hematology ROS (+)   Anesthesia Other Findings   Reproductive/Obstetrics (+) Pregnancy                             Anesthesia Physical Anesthesia Plan  ASA: II  Anesthesia Plan: Epidural   Post-op Pain Management:    Induction:   Airway Management Planned:   Additional Equipment:   Intra-op Plan:   Post-operative Plan:   Informed Consent: I have reviewed the patients History and Physical, chart, labs and discussed the procedure including the risks, benefits and alternatives for the proposed anesthesia with the patient or authorized representative who has indicated his/her understanding and acceptance.     Plan Discussed with: Anesthesiologist  Anesthesia Plan Comments:         Anesthesia Quick Evaluation

## 2016-06-05 NOTE — OB Triage Note (Signed)
Patient presented to labor and delivery complaining of contractions since last night that have gotten increasingly more painful and regular.  Patient reports bloody show. Denies leaking of fluid or decreased fetal movement.

## 2016-06-06 LAB — CBC
HCT: 32.2 % — ABNORMAL LOW (ref 35.0–47.0)
HEMOGLOBIN: 11 g/dL — AB (ref 12.0–16.0)
MCH: 32.2 pg (ref 26.0–34.0)
MCHC: 34.1 g/dL (ref 32.0–36.0)
MCV: 94.5 fL (ref 80.0–100.0)
PLATELETS: 236 10*3/uL (ref 150–440)
RBC: 3.41 MIL/uL — AB (ref 3.80–5.20)
RDW: 13.7 % (ref 11.5–14.5)
WBC: 12.1 10*3/uL — AB (ref 3.6–11.0)

## 2016-06-06 LAB — RPR: RPR: NONREACTIVE

## 2016-06-06 NOTE — Progress Notes (Signed)
Post Partum Day # 1, s/p SVD  Subjective: no complaints, up ad lib, voiding and tolerating PO  Objective: Temp:  [98 F (36.7 C)-99.3 F (37.4 C)] 98.6 F (37 C) (05/10 0721) Pulse Rate:  [75-113] 78 (05/10 0721) Resp:  [18] 18 (05/10 0721) BP: (97-132)/(50-81) 97/53 (05/10 0721) SpO2:  [95 %-98 %] 96 % (05/10 0721)  Physical Exam:  General: alert and no distress  Lungs: clear to auscultation bilaterally Breasts: normal appearance, no masses or tenderness Heart: regular rate and rhythm, S1, S2 normal, no murmur, click, rub or gallop Abdomen: soft, non-tender; bowel sounds normal; no masses,  no organomegaly Pelvis: Lochia: appropriate, Uterine Fundus: firm Extremities: DVT Evaluation: No evidence of DVT seen on physical exam. Negative Homan's sign. No cords or calf tenderness. No significant calf/ankle edema.   Recent Labs  06/05/16 0930 06/06/16 0538  HGB 12.9 11.0*  HCT 37.4 32.2*    Assessment/Plan: Plan for discharge tomorrow, Breastfeeding, Lactation consult and Contraception undecided.   LOS: 1 day   Hildred Laserherry, Tonea Leiphart, MD [facility] 06/06/2016 9:20 AM

## 2016-06-06 NOTE — Anesthesia Postprocedure Evaluation (Signed)
Anesthesia Post Note  Patient: Stephanie Potter  Procedure(s) Performed: * No procedures listed *  Patient location during evaluation: Mother Baby Anesthesia Type: Epidural Level of consciousness: awake, awake and alert and oriented Pain management: pain level controlled Vital Signs Assessment: post-procedure vital signs reviewed and stable Respiratory status: spontaneous breathing, nonlabored ventilation and respiratory function stable Cardiovascular status: blood pressure returned to baseline and stable Postop Assessment: no headache and no backache Anesthetic complications: no     Last Vitals:  Vitals:   06/06/16 0524 06/06/16 0721  BP: (!) 105/54 (!) 97/53  Pulse: 89 78  Resp:  18  Temp: 37 C 37 C    Last Pain:  Vitals:   06/06/16 0721  TempSrc: Oral  PainSc:                  [name]

## 2016-06-07 ENCOUNTER — Ambulatory Visit: Payer: Self-pay

## 2016-06-07 MED ORDER — DOCUSATE SODIUM 100 MG PO CAPS: 100.0000 mg | ORAL_CAPSULE | Freq: Two times a day (BID) | ORAL | 2 refills | Status: AC | PRN

## 2016-06-07 MED ORDER — IBUPROFEN 800 MG PO TABS: 800.0000 mg | ORAL_TABLET | Freq: Three times a day (TID) | ORAL | 1 refills | Status: AC | PRN

## 2016-06-07 NOTE — Progress Notes (Signed)
Post Partum Day # 1, s/p SVD  Subjective: no complaints, up ad lib, voiding and tolerating PO.  Objective: Vitals:   06/06/16 1611 06/06/16 1934 06/06/16 2318 06/07/16 0745  BP: 107/61 (!) 104/58 (!) 110/56 115/60  Pulse: 84 82 87 91  Resp: 18 18 20 18   Temp: 98.7 F (37.1 C) 97.5 F (36.4 C) 98 F (36.7 C) 97.8 F (36.6 C)  TempSrc: Oral Oral Oral Oral  SpO2:    100%    Physical Exam:  General: alert and no distress  Lungs: clear to auscultation bilaterally Breasts: normal appearance, no masses or tenderness Heart: regular rate and rhythm, S1, S2 normal, no murmur, click, rub or gallop Abdomen: soft, non-tender; bowel sounds normal; no masses,  no organomegaly Pelvis: Lochia: appropriate, Uterine Fundus: firm Extremities: DVT Evaluation: No evidence of DVT seen on physical exam. Negative Homan's sign. No cords or calf tenderness. No significant calf/ankle edema.   Recent Labs  06/05/16 0930 06/06/16 0538  HGB 12.9 11.0*  HCT 37.4 32.2*    Assessment/Plan: Plan for discharge today, Breastfeeding, s/pLactation consult and Contraception undecided. Will give handout on options. To discuss further at postpartum visit.    LOS: 2 days   Hildred Laserherry, Suriyah Vergara, MD [facility] 06/07/2016 10:14 AM

## 2016-06-07 NOTE — Lactation Note (Addendum)
This note was copied from a baby's chart. Lactation Consultation Note  Patient Name: Stephanie Potter Today's Date: 06/07/2016    Mom's right breast firm and baby can not deeply latch, pulls in lower lip and refuses efforts to pull down lower jaw, mom winces with pain on latch, pumping breasts does not soften areola or lengthen   nipple, attempted latch with nipple shield was unsuccessful, baby fussy and once latches causes pain for mom.   Maternal Data    Feeding Feeding Type: Breast Fed Length of feed: 35 min (per feeding sheet)  LATCH Score/Interventions   Father of baby in and able to assist mom with latching baby while massaging breast to soften on left breast.  Parents request demonstration of their Medela pump and style breast pump, this was done and questions answered.   AT discharge baby was latching better and breasts softer                 Lactation Tools Discussed/Used   Breast pump and breastmilk storage  Consult Status   Prn after d/c   Dyann KiefMarsha D Dennisse Swader 06/07/2016, 6:34 PM

## 2016-06-07 NOTE — Discharge Summary (Signed)
Obstetric Discharge Summary Reason for Admission: onset of labor Prenatal Procedures: ultrasound Intrapartum Procedures: spontaneous vaginal delivery and vacuum (for non-reassuring fetal tracing in second stage) Postpartum Procedures: none Complications-Operative and Postpartum: 2nd degree degree perineal laceration and vaginal laceration Hemoglobin  Date Value Ref Range Status  06/06/2016 11.0 (L) 12.0 - 16.0 g/dL Final   HCT  Date Value Ref Range Status  06/06/2016 32.2 (L) 35.0 - 47.0 % Final   Hematocrit  Date Value Ref Range Status  04/16/2016 33.6 (L) 34.0 - 46.6 % Final    Physical Exam:  Blood pressure 115/60, pulse 91, temperature 98.1 F (36.7 C), temperature source Axillary, resp. rate 18, last menstrual period 05/23/2015, SpO2 100 %, unknown if currently breastfeeding.  General: alert and no distress Lochia: appropriate Uterine Fundus: firm Incision: None DVT Evaluation: No evidence of DVT seen on physical exam. Negative Homan's sign. No cords or calf tenderness. No significant calf/ankle edema.  Discharge Diagnoses: Term Pregnancy-delivered  Discharge Information: Date: 06/07/2016 Activity: pelvic rest Diet: routine Medications: PNV, Ibuprofen and Colace Condition: stable Instructions: refer to practice specific booklet Discharge to: home Follow-up Information    Stephanie Potter, Stephanie Kunin, MD Follow up in 6 week(s).   Specialties:  Obstetrics and Gynecology, Radiology Why:  Postpartum visit Contact information: [address] [place] [id number] [phone]           Newborn Data: Live born female  Birth Weight: 6 lb 13.4 oz (3100 g) APGAR: 8, 9  Home with mother.  Stephanie Lasernika Koren [place] 06/07/2016, 10:18 AM

## 2016-06-07 NOTE — Progress Notes (Signed)
Patient discharge to home via wheelchair with spouse and baby. Discharge instructions reviewed with patient.  All questions answered.

## 2016-06-07 NOTE — Plan of Care (Signed)
Problem: Nutritional: Goal: Mothers verbalization of comfort with breastfeeding process will improve Outcome: Not Progressing Needing Extra support with Breast Feeding. Infant is wanting to Breast Feed frequently. Mom's Breast are getting Tender. Ibuprofen, Comfort Gels and Cocoanut Oil being used .

## 2016-07-17 ENCOUNTER — Encounter: Payer: Medicaid Other | Admitting: Obstetrics and Gynecology

## 2016-07-18 ENCOUNTER — Ambulatory Visit (INDEPENDENT_AMBULATORY_CARE_PROVIDER_SITE_OTHER): Payer: Medicaid Other | Admitting: Obstetrics and Gynecology

## 2016-07-18 ENCOUNTER — Encounter: Payer: Medicaid Other | Admitting: Obstetrics and Gynecology

## 2016-07-18 ENCOUNTER — Encounter: Payer: Self-pay | Admitting: Obstetrics and Gynecology

## 2016-07-18 DIAGNOSIS — F53 Postpartum depression: Secondary | ICD-10-CM

## 2016-07-18 DIAGNOSIS — O99345 Other mental disorders complicating the puerperium: Secondary | ICD-10-CM

## 2016-07-18 DIAGNOSIS — Z3009 Encounter for other general counseling and advice on contraception: Secondary | ICD-10-CM

## 2016-07-18 NOTE — Progress Notes (Signed)
Pt is here for a post partum visit.Has not resumed intercourse. Is interested in birth control- unsure what method.Scored 18 on depression screening

## 2016-07-18 NOTE — Patient Instructions (Signed)
Contraception Choices Contraception (birth control) is the use of any methods or devices to prevent pregnancy. Below are some methods to help avoid pregnancy. Hormonal methods  Contraceptive implant. This is a thin, plastic tube containing progesterone hormone. It does not contain estrogen hormone. Your health care provider inserts the tube in the inner part of the upper arm. The tube can remain in place for up to 3 years. After 3 years, the implant must be removed. The implant prevents the ovaries from releasing an egg (ovulation), thickens the cervical mucus to prevent sperm from entering the uterus, and thins the lining of the inside of the uterus.  Progesterone-only injections. These injections are given every 3 months by your health care provider to prevent pregnancy. This synthetic progesterone hormone stops the ovaries from releasing eggs. It also thickens cervical mucus and changes the uterine lining. This makes it harder for sperm to survive in the uterus.  Birth control pills. These pills contain estrogen and progesterone hormone. They work by preventing the ovaries from releasing eggs (ovulation). They also cause the cervical mucus to thicken, preventing the sperm from entering the uterus. Birth control pills are prescribed by a health care provider.Birth control pills can also be used to treat heavy periods.  Minipill. This type of birth control pill contains only the progesterone hormone. They are taken every day of each month and must be prescribed by your health care provider.  Birth control patch. The patch contains hormones similar to those in birth control pills. It must be changed once a week and is prescribed by a health care provider.  Vaginal ring. The ring contains hormones similar to those in birth control pills. It is left in the vagina for 3 weeks, removed for 1 week, and then a new one is put back in place. The patient must be comfortable inserting and removing the ring from  the vagina.A health care provider's prescription is necessary.  Emergency contraception. Emergency contraceptives prevent pregnancy after unprotected sexual intercourse. This pill can be taken right after sex or up to 5 days after unprotected sex. It is most effective the sooner you take the pills after having sexual intercourse. Most emergency contraceptive pills are available without a prescription. Check with your pharmacist. Do not use emergency contraception as your only form of birth control. Barrier methods  Female condom. This is a thin sheath (latex or rubber) that is worn over the penis during sexual intercourse. It can be used with spermicide to increase effectiveness.  Female condom. This is a soft, loose-fitting sheath that is put into the vagina before sexual intercourse.  Diaphragm. This is a soft, latex, dome-shaped barrier that must be fitted by a health care provider. It is inserted into the vagina, along with a spermicidal jelly. It is inserted before intercourse. The diaphragm should be left in the vagina for 6 to 8 hours after intercourse.  Cervical cap. This is a round, soft, latex or plastic cup that fits over the cervix and must be fitted by a health care provider. The cap can be left in place for up to 48 hours after intercourse.  Sponge. This is a soft, circular piece of polyurethane foam. The sponge has spermicide in it. It is inserted into the vagina after wetting it and before sexual intercourse.  Spermicides. These are chemicals that kill or block sperm from entering the cervix and uterus. They come in the form of creams, jellies, suppositories, foam, or tablets. They do not require a prescription. They   are inserted into the vagina with an applicator before having sexual intercourse. The process must be repeated every time you have sexual intercourse. Intrauterine contraception  Intrauterine device (IUD). This is a T-shaped device that is put in a woman's uterus during  a menstrual period to prevent pregnancy. There are 2 types: ? Copper IUD. This type of IUD is wrapped in copper wire and is placed inside the uterus. Copper makes the uterus and fallopian tubes produce a fluid that kills sperm. It can stay in place for 10 years. ? Hormone IUD. This type of IUD contains the hormone progestin (synthetic progesterone). The hormone thickens the cervical mucus and prevents sperm from entering the uterus, and it also thins the uterine lining to prevent implantation of a fertilized egg. The hormone can weaken or kill the sperm that get into the uterus. It can stay in place for 3-5 years, depending on which type of IUD is used. Permanent methods of contraception  Female tubal ligation. This is when the woman's fallopian tubes are surgically sealed, tied, or blocked to prevent the egg from traveling to the uterus.  Hysteroscopic sterilization. This involves placing a small coil or insert into each fallopian tube. Your doctor uses a technique called hysteroscopy to do the procedure. The device causes scar tissue to form. This results in permanent blockage of the fallopian tubes, so the sperm cannot fertilize the egg. It takes about 3 months after the procedure for the tubes to become blocked. You must use another form of birth control for these 3 months.  Female sterilization. This is when the female has the tubes that carry sperm tied off (vasectomy).This blocks sperm from entering the vagina during sexual intercourse. After the procedure, the man can still ejaculate fluid (semen). Natural planning methods  Natural family planning. This is not having sexual intercourse or using a barrier method (condom, diaphragm, cervical cap) on days the woman could become pregnant.  Calendar method. This is keeping track of the length of each menstrual cycle and identifying when you are fertile.  Ovulation method. This is avoiding sexual intercourse during ovulation.  Symptothermal method.  This is avoiding sexual intercourse during ovulation, using a thermometer and ovulation symptoms.  Post-ovulation method. This is timing sexual intercourse after you have ovulated. Regardless of which type or method of contraception you choose, it is important that you use condoms to protect against the transmission of sexually transmitted infections (STIs). Talk with your health care provider about which form of contraception is most appropriate for you. This information is not intended to replace advice given to you by your health care provider. Make sure you discuss any questions you have with your health care provider. Document Released: 01/14/2005 Document Revised: 06/22/2015 Document Reviewed: 07/09/2012 Elsevier Interactive Patient Education  2017 [employer]  Perinatal Depression When a woman feels excessive sadness, anger, or anxiety during pregnancy or during the first 12 months after she gives birth, she has a condition called perinatal depression. Depression can interfere with work, school, relationships, and other everyday activities. If it is not managed properly, it can also cause problems in the mother and her baby. Sometimes, perinatal depression is left untreated because symptoms are thought to be normal mood swings during and right after pregnancy. If you have symptoms of depression, it is important to talk with your health care provider. What are the causes? The exact cause of this condition is not known. Hormonal changes during and after pregnancy may play a role in causing perinatal  depression. What increases the risk? You are more likely to develop this condition if:  You have a personal or family history of depression, anxiety, or mood disorders.  You experience a stressful life event during pregnancy, such as the death of a loved one.  You have a lot of regular life stress.  You do not have support from family members or loved ones, or you are in an abusive  relationship.  What are the signs or symptoms? Symptoms of this condition include:  Feeling sad or hopeless.  Feelings of guilt.  Feeling irritable or overwhelmed.  Changes in your appetite.  Lack of energy or motivation.  Sleep problems.  Difficulty concentrating or completing tasks.  Loss of interest in hobbies or relationships.  Headaches or stomach problems that do not go away.  How is this diagnosed? This condition is diagnosed based on a physical exam and mental evaluation. In some cases, your health care provider may use a depression screening tool. These tools include a list of questions that can help a health care provider diagnose depression. Your health care provider may refer you to a mental health expert who specializes in depression. How is this treated? This condition may be treated with:  Medicines. Your health care provider will only give you medicines that have been proven safe for pregnancy and breastfeeding.  Talk therapy with a mental health professional to help change your patterns of thinking (cognitive behavioral therapy).  Support groups.  Brain stimulation or light therapies.  Stress reduction therapies, such as mindfulness.  Follow these instructions at home: Lifestyle  Do not use any products that contain nicotine or tobacco, such as cigarettes and e-cigarettes. If you need help quitting, ask your health care provider.  Do not use alcohol when you are pregnant. After your baby is born, limit alcohol intake to no more than 1 drink a day. One drink equals 12 oz of beer, 5 oz of wine, or 1 oz of hard liquor.  Consider joining a support group for new mothers. Ask your health care provider for recommendations.  Take good care of yourself. Make sure you: ? Get plenty of sleep. If you are having trouble sleeping, talk with your health care provider. ? Eat a healthy diet. This includes plenty of fruits and vegetables, whole grains, and lean  proteins. ? Exercise regularly, as told by your health care provider. Ask your health care provider what exercises are safe for you. General instructions  Take over-the-counter and prescription medicines only as told by your health care provider.  Talk with your partner or family members about your feelings during pregnancy. Share any concerns or anxieties that you may have.  Ask for help with tasks or chores when you need it. Ask friends and family members to provide meals, watch your children, or help with cleaning.  Keep all follow-up visits as told by your health care provider. This is important. Contact a health care provider if:  You (or people close to you) notice that you have any symptoms of depression.  You have depression and your symptoms get worse.  You experience side effects from medicines, such as nausea or sleep problems. Get help right away if:  You feel like hurting yourself, your baby, or someone else. If you ever feel like you may hurt yourself or others, or have thoughts about taking your own life, get help right away. You can go to your nearest emergency department or call:  Your local emergency services (911 in the  [place]  A suicide crisis helpline, such as the National Suicide Prevention Lifeline at (431)062-8851. This is open 24 hours a day.  Summary  Perinatal depression is when a woman feels excessive sadness, anger, or anxiety during pregnancy or during the first 12 months after she gives birth.  If perinatal depression is not treated, it can lead to health problems for the mother and her baby.  This condition is treated with medicines, talk therapy, stress reduction therapies, or a combination of two or more treatments.  Talk with your partner or family members about your feelings. Do not be afraid to ask for help. This information is not intended to replace advice given to you by your health care provider. Make sure you discuss any questions you  have with your health care provider. Document Released: 03/13/2016 Document Revised: 03/13/2016 Document Reviewed: 03/13/2016 Elsevier Interactive Patient Education  [employer].

## 2016-07-18 NOTE — Progress Notes (Signed)
   [facility] POSTPARTUM [facility] PROGRESS NOTE  Subjective:     Stephanie Potter is a 31 y.o. [id number] female who presents for a postpartum visit. She is 6 weeks postpartum following a vacuum-assisted spontaneous vaginal delivery (placed for non-reassuring fetal tracing during second stage of labor). I have fully reviewed the prenatal and intrapartum course. The delivery was at 40 gestational weeks, complicated by postpartum hemorrhage (EBL 500 ml, not requiring treatment).  Anesthesia: epidural. Postpartum course has been well. Baby's course has been well. Baby is feeding by breast. Bleeding: patient has not resumed menses, with No LMP recorded.. Bowel function is normal. Bladder function is normal. Patient is not sexually active. Contraception method desired is unsure. Postpartum depression screening: positive (Eddinburgh Depression Scale was 18) .  The following portions of the patient's history were reviewed and updated as appropriate: allergies, current medications, past family history, past medical history, past social history, past surgical history and problem list.  Review of Systems Pertinent items noted in HPI and remainder of comprehensive ROS otherwise negative.   Objective:    BP 109/62   Pulse 62   Ht 5\' 1"  (1.549 m)   Wt 128 lb 2 oz (58.1 kg)   Breastfeeding? Yes   BMI 24.21 kg/m   General:  alert and no distress   Breasts:  inspection negative, no nipple discharge or bleeding, no masses or nodularity palpable  Lungs: clear to auscultation bilaterally  Heart:  regular rate and rhythm, S1, S2 normal, no murmur, click, rub or gallop  Abdomen: soft, non-tender; bowel sounds normal; no masses,  no organomegaly.  Well healed Pfannenstiel incision   Vulva:  normal  Vagina: normal vagina, no discharge, exudate, lesion, or erythema  Cervix:  no cervical motion tenderness and no lesions  Corpus: normal size, contour, position, consistency, mobility, non-tender  Adnexa:  normal adnexa and  no mass, fullness, tenderness  Rectal Exam: Not performed.         Labs:  Lab Results  Component Value Date   HGB 11.0 (L) 06/06/2016     Assessment:    Routine postpartum exam.   Postpartum depression Contraception counseling  Plan:    1. Contraception: undecided.  Reviewed all forms of birth control options available including abstinence; over the counter/barrier methods; hormonal contraceptive medication including pill, patch, ring, injection,contraceptive implant; hormonal and nonhormonal IUDs; permanent sterilization options including vasectomy and the various tubal sterilization modalities. Risks and benefits reviewed.  Questions were answered.  Information was given to patient to review.  2. Postpartum depression - patient scored 18 on EDS.  Discussed symptoms with patient.  Discussed management options, including psychotherapy, and medications if desired.  Patient notes she would like to try medication.  Will prescribe Zoloft.  Also patient notes that with the new baby and her current depression, she does not think she will be able to return to school next semester.  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   Advised that if she felt that her symptoms were worsening, she should inform MD as soon as possible. Will write a letter for her fall semester.  3.  Patient can resume all other normal activities.  4. Follow up in: 4 weeks after stat of Zoloft.  1 year for annual exam, or sooner as needed.    Hildred Laserherry, Amylah Will, MD [facility]

## 2016-07-21 DIAGNOSIS — F53 Postpartum depression: Secondary | ICD-10-CM | POA: Insufficient documentation

## 2016-07-21 DIAGNOSIS — O99345 Other mental disorders complicating the puerperium: Secondary | ICD-10-CM

## 2016-07-21 MED ORDER — SERTRALINE HCL 25 MG PO TABS: 25.0000 mg | ORAL_TABLET | Freq: Every day | ORAL | 3 refills | Status: AC

## 2016-08-15 ENCOUNTER — Encounter: Payer: Self-pay | Admitting: Obstetrics and Gynecology

## 2016-08-28 ENCOUNTER — Encounter: Payer: Self-pay | Admitting: Obstetrics and Gynecology

## 2016-09-04 NOTE — Telephone Encounter (Signed)
Please call patient and let her know when she can get the note she discussed with you  She states she needs it ASAP

## 2016-09-06 ENCOUNTER — Encounter: Payer: Self-pay | Admitting: Obstetrics and Gynecology

## 2016-09-17 ENCOUNTER — Encounter: Payer: Self-pay | Admitting: Obstetrics and Gynecology

## 2016-10-02 ENCOUNTER — Encounter: Payer: Self-pay | Admitting: Obstetrics and Gynecology

## 2016-11-28 ENCOUNTER — Encounter: Payer: Medicaid Other | Admitting: Obstetrics and Gynecology
